# Patient Record
Sex: Female | Born: 1980
Health system: Southern US, Community
[De-identification: ages and names within clinical notes are randomized; demographics above are authoritative.]

## PROBLEM LIST (undated history)

## (undated) DIAGNOSIS — E119 Type 2 diabetes mellitus without complications: Secondary | ICD-10-CM

## (undated) DIAGNOSIS — O24419 Gestational diabetes mellitus in pregnancy, unspecified control: Secondary | ICD-10-CM

## (undated) DIAGNOSIS — I10 Essential (primary) hypertension: Secondary | ICD-10-CM

## (undated) HISTORY — DX: Essential (primary) hypertension: I10

## (undated) HISTORY — DX: Type 2 diabetes mellitus without complications: E11.9

## (undated) HISTORY — DX: Gestational diabetes mellitus in pregnancy, unspecified control: O24.419

---

## 2019-05-17 ENCOUNTER — Ambulatory Visit (INDEPENDENT_AMBULATORY_CARE_PROVIDER_SITE_OTHER): Payer: Medicaid Other | Admitting: Family Medicine

## 2019-05-17 ENCOUNTER — Encounter: Payer: Self-pay | Admitting: Family Medicine

## 2019-05-17 ENCOUNTER — Other Ambulatory Visit (HOSPITAL_COMMUNITY)
Admission: RE | Admit: 2019-05-17 | Discharge: 2019-05-17 | Disposition: A | Discharge status: Home / Self Care | Payer: Medicaid Other | Source: Ambulatory Visit | Attending: Family Medicine | Admitting: Family Medicine

## 2019-05-17 ENCOUNTER — Other Ambulatory Visit: Payer: Self-pay

## 2019-05-17 VITALS — BP 155/81 | HR 122 | Ht 61.0 in | Wt 250.0 lb

## 2019-05-17 DIAGNOSIS — O09299 Supervision of pregnancy with other poor reproductive or obstetric history, unspecified trimester: Secondary | ICD-10-CM | POA: Insufficient documentation

## 2019-05-17 DIAGNOSIS — Z3480 Encounter for supervision of other normal pregnancy, unspecified trimester: Secondary | ICD-10-CM | POA: Insufficient documentation

## 2019-05-17 DIAGNOSIS — Z23 Encounter for immunization: Secondary | ICD-10-CM | POA: Diagnosis not present

## 2019-05-17 DIAGNOSIS — Z8632 Personal history of gestational diabetes: Secondary | ICD-10-CM | POA: Insufficient documentation

## 2019-05-17 DIAGNOSIS — Z3A10 10 weeks gestation of pregnancy: Secondary | ICD-10-CM

## 2019-05-17 DIAGNOSIS — Z98891 History of uterine scar from previous surgery: Secondary | ICD-10-CM | POA: Insufficient documentation

## 2019-05-17 DIAGNOSIS — O24111 Pre-existing diabetes mellitus, type 2, in pregnancy, first trimester: Secondary | ICD-10-CM

## 2019-05-17 DIAGNOSIS — O09291 Supervision of pregnancy with other poor reproductive or obstetric history, first trimester: Secondary | ICD-10-CM

## 2019-05-17 DIAGNOSIS — O24119 Pre-existing diabetes mellitus, type 2, in pregnancy, unspecified trimester: Secondary | ICD-10-CM

## 2019-05-17 LAB — POCT URINALYSIS DIPSTICK OB
Blood, UA: NEGATIVE
Ketones, UA: POSITIVE
Leukocytes, UA: NEGATIVE
Nitrite, UA: NEGATIVE
POC,PROTEIN,UA: NEGATIVE
Spec Grav, UA: 1.02 (ref 1.010–1.025)
pH, UA: 8 (ref 5.0–8.0)

## 2019-05-17 NOTE — Progress Notes (Signed)
Subjective:  Sandra Mason is a G2P1001 102w4d being seen today for her first obstetrical visit.  Her obstetrical history is significant for advanced maternal age and c/s for first delivery due to preeclamsia, GDM on insulin, and breech.. Patient does intend to breast feed. Pregnancy history fully reviewed.  Patient reports no complaints.  BP (!) 155/81   Pulse (!) 122   Ht 5\' 1"  (1.549 m)   Wt 250 lb (113.4 kg)   LMP 03/04/2019 (Exact Date)   BMI 47.24 kg/m   HISTORY: OB History  Gravida Para Term Preterm AB Living  2 1 1     1   SAB TAB Ectopic Multiple Live Births          1    # Outcome Date GA Lbr Len/2nd Weight Sex Delivery Anes PTL Lv  2 Current           1 Term 2016 [redacted]w[redacted]d   F CS-LTranv EPI N LIV    History reviewed. No pertinent past medical history.  Past Surgical History:  Procedure Laterality Date  . CESAREAN SECTION      Family History  Problem Relation Age of Onset  . Diabetes Mother   . Hypertension Mother   . Lung cancer Father      Exam  BP (!) 155/81   Pulse (!) 122   Ht 5\' 1"  (1.549 m)   Wt 250 lb (113.4 kg)   LMP 03/04/2019 (Exact Date)   BMI 47.24 kg/m   CONSTITUTIONAL: Well-developed, well-nourished female in no acute distress.  HENT:  Normocephalic, atraumatic, External right and left ear normal. Oropharynx is clear and moist EYES: Conjunctivae and EOM are normal. Pupils are equal, round, and reactive to light. No scleral icterus.  NECK: Normal range of motion, supple, no masses.  Normal thyroid.  CARDIOVASCULAR: Normal heart rate noted, regular rhythm RESPIRATORY: Clear to auscultation bilaterally. Effort and breath sounds normal, no problems with respiration noted. BREASTS: Symmetric in size. No masses, skin changes, nipple drainage, or lymphadenopathy. ABDOMEN: Soft, normal bowel sounds, no distention noted.  No tenderness, rebound or guarding.  PELVIC: Normal appearing external genitalia; normal appearing vaginal mucosa and cervix.  No abnormal discharge noted. Normal uterine size, no other palpable masses, no uterine or adnexal tenderness. MUSCULOSKELETAL: Normal range of motion. No tenderness.  No cyanosis, clubbing, or edema.  2+ distal pulses. SKIN: Skin is warm and dry. No rash noted. Not diaphoretic. No erythema. No pallor. NEUROLOGIC: Alert and oriented to person, place, and time. Normal reflexes, muscle tone coordination. No cranial nerve deficit noted. PSYCHIATRIC: Normal mood and affect. Normal behavior. Normal judgment and thought content.    Assessment:    Pregnancy: G2P1001 Patient Active Problem List   Diagnosis Date Noted  . Supervision of other normal pregnancy, antepartum 05/17/2019  . Hx of pre-eclampsia in prior pregnancy, currently pregnant, unspecified trimester 05/17/2019  . History of gestational diabetes mellitus (GDM) 05/17/2019  . History of C-section 05/17/2019      Plan:   1. Supervision of other normal pregnancy, antepartum Desires panorama. - HgB A1c - Hemoglobinopathy Evaluation - Obstetric Panel, Including HIV - SMN1 COPY NUMBER ANALYSIS (SMA Carrier Screen) - Cystic fibrosis gene test - Flu Vaccine QUAD 36+ mos IM - Culture, OB Urine - Protein / creatinine ratio, urine - POC Urinalysis Dipstick OB - Cytology - PAP( )  2. Hx of pre-eclampsia in prior pregnancy, currently pregnant, unspecified trimester ASA at 12 weeks CMP, UP:C  - HgB A1c - Hemoglobinopathy Evaluation -  Obstetric Panel, Including HIV - SMN1 COPY NUMBER ANALYSIS (SMA Carrier Screen) - Cystic fibrosis gene test - Flu Vaccine QUAD 36+ mos IM - Culture, OB Urine - Protein / creatinine ratio, urine - POC Urinalysis Dipstick OB  3. History of C-section Desires TOLAC Patient counseled, but paper not signed yet. - HgB A1c - Hemoglobinopathy Evaluation - Obstetric Panel, Including HIV - SMN1 COPY NUMBER ANALYSIS (SMA Carrier Screen) - Cystic fibrosis gene test - Flu Vaccine QUAD 36+ mos  IM - Culture, OB Urine - Protein / creatinine ratio, urine  4. History of gestational diabetes mellitus (GDM) Check HgA1c  5. Obesity     Problem list reviewed and updated. 75% of 30 min visit spent on counseling and coordination of care.     Truett Mainland 05/17/2019

## 2019-05-18 LAB — PROTEIN / CREATININE RATIO, URINE
Creatinine, Urine: 48.3 mg/dL
Protein, Ur: 70.2 mg/dL
Protein/Creat Ratio: 1453 mg/g creat — ABNORMAL HIGH (ref 0–200)

## 2019-05-20 LAB — URINE CULTURE, OB REFLEX

## 2019-05-20 LAB — CULTURE, OB URINE

## 2019-05-25 LAB — HEMOGLOBINOPATHY EVALUATION
Ferritin: 141 ng/mL (ref 15–150)
Hgb A2 Quant: 2.1 % (ref 1.8–3.2)
Hgb A: 97.9 % (ref 96.4–98.8)
Hgb C: 0 %
Hgb F Quant: 0 % (ref 0.0–2.0)
Hgb S: 0 %
Hgb Solubility: NEGATIVE
Hgb Variant: 0 %

## 2019-05-25 LAB — OBSTETRIC PANEL, INCLUDING HIV
Antibody Screen: NEGATIVE
Basophils Absolute: 0.1 10*3/uL (ref 0.0–0.2)
Basos: 1 %
EOS (ABSOLUTE): 0.3 10*3/uL (ref 0.0–0.4)
Eos: 3 %
HIV Screen 4th Generation wRfx: NONREACTIVE
Hematocrit: 43.2 % (ref 34.0–46.6)
Hemoglobin: 14.7 g/dL (ref 11.1–15.9)
Hepatitis B Surface Ag: NEGATIVE
Immature Grans (Abs): 0.2 10*3/uL — ABNORMAL HIGH (ref 0.0–0.1)
Immature Granulocytes: 2 %
Lymphocytes Absolute: 2.1 10*3/uL (ref 0.7–3.1)
Lymphs: 21 %
MCH: 29.6 pg (ref 26.6–33.0)
MCHC: 34 g/dL (ref 31.5–35.7)
MCV: 87 fL (ref 79–97)
Monocytes Absolute: 0.6 10*3/uL (ref 0.1–0.9)
Monocytes: 6 %
Neutrophils Absolute: 7 10*3/uL (ref 1.4–7.0)
Neutrophils: 67 %
Platelets: 306 10*3/uL (ref 150–450)
RBC: 4.97 x10E6/uL (ref 3.77–5.28)
RDW: 13.2 % (ref 11.7–15.4)
RPR Ser Ql: NONREACTIVE
Rh Factor: POSITIVE
Rubella Antibodies, IGG: 17.4 index (ref >0.99)
WBC: 10.3 10*3/uL (ref 3.4–10.8)

## 2019-05-25 LAB — HEMOGLOBIN A1C
Est. average glucose Bld gHb Est-mCnc: 214 mg/dL
Hgb A1c MFr Bld: 9.1 % — ABNORMAL HIGH (ref 4.8–5.6)

## 2019-05-25 LAB — SMN1 COPY NUMBER ANALYSIS (SMA CARRIER SCREENING)

## 2019-05-25 LAB — CYSTIC FIBROSIS GENE TEST

## 2019-05-25 MED ORDER — GLUCOSE BLOOD VI STRP: ORAL_STRIP | 12 refills | Status: DC

## 2019-05-25 MED ORDER — ACCU-CHEK FASTCLIX LANCETS MISC: 1.0000 [IU] | Freq: Four times a day (QID) | 12 refills | Status: AC

## 2019-05-25 MED ORDER — ACCU-CHEK NANO SMARTVIEW W/DEVICE KIT: 1.0000 | PACK | 0 refills | Status: AC

## 2019-05-25 NOTE — Addendum Note (Signed)
Addended by: Truett Mainland on: 05/25/2019 10:42 AM   Modules accepted: Orders

## 2019-05-28 ENCOUNTER — Telehealth: Payer: Self-pay

## 2019-05-28 NOTE — Telephone Encounter (Signed)
Called pt regarding diabetes results. Unable to leave message due to pt's phone not working. Will try to call again and give pt results at [place] on 05/31/19.  chiquita l wilson, CMA

## 2019-05-29 LAB — CYTOLOGY - PAP
Comment: NEGATIVE
Diagnosis: NEGATIVE
Diagnosis: REACTIVE
High risk HPV: NEGATIVE

## 2019-05-31 ENCOUNTER — Ambulatory Visit (HOSPITAL_BASED_OUTPATIENT_CLINIC_OR_DEPARTMENT_OTHER)
Admission: RE | Admit: 2019-05-31 | Discharge: 2019-05-31 | Disposition: A | Discharge status: Home / Self Care | Payer: Medicaid Other | Source: Ambulatory Visit | Attending: Family Medicine | Admitting: Family Medicine

## 2019-05-31 ENCOUNTER — Ambulatory Visit (HOSPITAL_BASED_OUTPATIENT_CLINIC_OR_DEPARTMENT_OTHER): Payer: Medicaid Other

## 2019-05-31 ENCOUNTER — Other Ambulatory Visit: Payer: Self-pay

## 2019-05-31 ENCOUNTER — Ambulatory Visit (INDEPENDENT_AMBULATORY_CARE_PROVIDER_SITE_OTHER): Payer: Medicaid Other | Admitting: Family Medicine

## 2019-05-31 VITALS — BP 142/94 | HR 120 | Wt 249.0 lb

## 2019-05-31 DIAGNOSIS — Z3480 Encounter for supervision of other normal pregnancy, unspecified trimester: Secondary | ICD-10-CM

## 2019-05-31 DIAGNOSIS — O09299 Supervision of pregnancy with other poor reproductive or obstetric history, unspecified trimester: Secondary | ICD-10-CM

## 2019-05-31 DIAGNOSIS — O10919 Unspecified pre-existing hypertension complicating pregnancy, unspecified trimester: Secondary | ICD-10-CM | POA: Insufficient documentation

## 2019-05-31 DIAGNOSIS — Z6841 Body Mass Index (BMI) 40.0 and over, adult: Secondary | ICD-10-CM

## 2019-05-31 DIAGNOSIS — I10 Essential (primary) hypertension: Secondary | ICD-10-CM | POA: Insufficient documentation

## 2019-05-31 DIAGNOSIS — O24119 Pre-existing diabetes mellitus, type 2, in pregnancy, unspecified trimester: Secondary | ICD-10-CM | POA: Insufficient documentation

## 2019-05-31 NOTE — Progress Notes (Signed)
No Gestational sac seen. Will get HCG and formal US.  CMP today

## 2019-05-31 NOTE — Progress Notes (Signed)
Bedside ultrasound perform and and no gestational sac visualized. Patient sent for formal ultrasound with imaging today. Kathrene Alu RN

## 2019-06-01 ENCOUNTER — Telehealth: Payer: Self-pay

## 2019-06-01 ENCOUNTER — Other Ambulatory Visit: Payer: Self-pay

## 2019-06-01 ENCOUNTER — Other Ambulatory Visit (HOSPITAL_BASED_OUTPATIENT_CLINIC_OR_DEPARTMENT_OTHER): Payer: Medicaid Other

## 2019-06-01 DIAGNOSIS — O0990 Supervision of high risk pregnancy, unspecified, unspecified trimester: Secondary | ICD-10-CM

## 2019-06-01 LAB — COMPREHENSIVE METABOLIC PANEL
ALT: 31 IU/L (ref 0–32)
ALT: 28 IU/L (ref 0–32)
AST: 27 IU/L (ref 0–40)
AST: 27 IU/L (ref 0–40)
Albumin/Globulin Ratio: 1.4 (ref 1.2–2.2)
Albumin/Globulin Ratio: 1.3 (ref 1.2–2.2)
Albumin: 3.7 g/dL — ABNORMAL LOW (ref 3.8–4.8)
Albumin: 3.7 g/dL — ABNORMAL LOW (ref 3.8–4.8)
Alkaline Phosphatase: 84 IU/L (ref 39–117)
Alkaline Phosphatase: 74 IU/L (ref 39–117)
BUN/Creatinine Ratio: 20 (ref 9–23)
BUN/Creatinine Ratio: 16 (ref 9–23)
BUN: 12 mg/dL (ref 6–20)
BUN: 10 mg/dL (ref 6–20)
Bilirubin Total: <0.2 mg/dL (ref 0.0–1.2)
Bilirubin Total: 0.2 mg/dL (ref 0.0–1.2)
CO2: 14 mmol/L — ABNORMAL LOW (ref 20–29)
CO2: 19 mmol/L — ABNORMAL LOW (ref 20–29)
Calcium: 9.5 mg/dL (ref 8.7–10.2)
Calcium: 9.7 mg/dL (ref 8.7–10.2)
Chloride: 97 mmol/L (ref 96–106)
Chloride: 99 mmol/L (ref 96–106)
Creatinine, Ser: 0.59 mg/dL (ref 0.57–1.00)
Creatinine, Ser: 0.63 mg/dL (ref 0.57–1.00)
GFR calc Af Amer: 135 mL/min/{1.73_m2} (ref >59)
GFR calc Af Amer: 133 mL/min/{1.73_m2} (ref >59)
GFR calc non Af Amer: 117 mL/min/{1.73_m2} (ref >59)
GFR calc non Af Amer: 115 mL/min/{1.73_m2} (ref >59)
Globulin, Total: 2.7 g/dL (ref 1.5–4.5)
Globulin, Total: 2.8 g/dL (ref 1.5–4.5)
Glucose: 197 mg/dL — ABNORMAL HIGH (ref 65–99)
Glucose: 237 mg/dL — ABNORMAL HIGH (ref 65–99)
Potassium: 3.7 mmol/L (ref 3.5–5.2)
Potassium: 4.2 mmol/L (ref 3.5–5.2)
Sodium: 132 mmol/L — ABNORMAL LOW (ref 134–144)
Sodium: 135 mmol/L (ref 134–144)
Total Protein: 6.4 g/dL (ref 6.0–8.5)
Total Protein: 6.5 g/dL (ref 6.0–8.5)

## 2019-06-01 LAB — SPECIMEN STATUS REPORT

## 2019-06-01 LAB — BETA HCG QUANT (REF LAB): hCG Quant: 38824 m[IU]/mL

## 2019-06-01 LAB — TSH: TSH: 0.956 u[IU]/mL (ref 0.450–4.500)

## 2019-06-01 MED ORDER — PRENATAL VITAMINS 28-0.8 MG PO TABS: 0.8000 mg | ORAL_TABLET | Freq: Every day | ORAL | 11 refills | Status: DC

## 2019-06-01 NOTE — Telephone Encounter (Signed)
Pt called requesting [place] results. Pt made aware that she embryo and cardiac activity was present bu yolk sac was not visualized. Pt made aware that she is 12w and 5d and baby's heart rate was 169. Pt advised to take prenatal vitamins. Understanding was voiced.  chiquita l wilson, CMA

## 2019-06-01 NOTE — Telephone Encounter (Signed)
Patient called and phone rings busy.   Trying to rely to patient that ultrasound looked good and her baby measures to her dates. Kathrene Alu RN

## 2019-06-04 ENCOUNTER — Ambulatory Visit: Payer: Medicaid Other

## 2019-06-05 ENCOUNTER — Other Ambulatory Visit: Payer: Self-pay

## 2019-06-05 ENCOUNTER — Ambulatory Visit: Payer: Medicaid Other | Admitting: *Deleted

## 2019-06-05 ENCOUNTER — Encounter: Payer: Medicaid Other | Attending: Obstetrics & Gynecology | Admitting: *Deleted

## 2019-06-05 DIAGNOSIS — Z3A00 Weeks of gestation of pregnancy not specified: Secondary | ICD-10-CM | POA: Insufficient documentation

## 2019-06-05 DIAGNOSIS — O24419 Gestational diabetes mellitus in pregnancy, unspecified control: Secondary | ICD-10-CM | POA: Insufficient documentation

## 2019-06-05 NOTE — Progress Notes (Signed)
Patient was seen on 06/05/2019 for type 2 Diabetes and pregnancy self-management. EDD 12/09/2019. Patient states history of this diabetes for 3 years since she had GDM with her 37 year old child. She has not had insurance, so no medication or follow up until now.  Diet history obtained. Patient eats good variety of all food groups and beverages include water and occasionally some diluted orange juice.  She states some training on Carbohydrate Counting. She states she is walking with her husband about 30 minutes every day. Patient is currently on no diabetes medications.  The following learning objectives were met by the patient :   States the definition of type 2 Diabetes and pregnancy   States why dietary management is important in controlling blood glucose  Describes the effects of carbohydrates on blood glucose levels  Demonstrates ability to create a balanced meal plan  Demonstrates carbohydrate counting   States when to check blood glucose levels  Demonstrates proper blood glucose monitoring techniques  States the effect of stress and exercise on blood glucose levels  States the importance of limiting caffeine and abstaining from alcohol and smoking  Plan:   Aim for 3 Carb Choices per meal (45 grams) +/- 1 either way   Aim for 1-2 Carbs per snack  Begin reading food labels for Total Carbohydrate of foods  Consider  increasing your activity level by walking or other activity daily as tolerated  Begin checking BG before breakfast and 2 hours after first bite of breakfast, lunch and dinner as directed by MD   Bring Log Book/Sheet and meter to every medical appointment OR use Baby Scripts (see below)  Patient was introduced to [employer], she plans to use as record of BG electronically   Take medication as directed by MD  Patient already has a meter: Accu Chek Guide Me And is testing pre breakfast and 2 hours each meal as directed by MD Review of Log Book shows: MD to follow  now that she is going to use Baby Scripts Patient instructed to test pre breakfast and 2 hours each meal as directed by MD  Patient instructed to monitor glucose levels: FBS: 60 - 95 mg/dl 2 hour: <120 mg/dl  Patient received the following handouts:  Nutrition Diabetes and Pregnancy  Carbohydrate Counting List  Patient will be seen for follow-up in one month to assess her control and provide additional education as needed

## 2019-06-06 NOTE — Progress Notes (Signed)
Received babyscripts notification for elevated BG. Reviewed results with Jeanie Sewer, RD, who saw pt yesterday in our office; recommends pt follow up with [facility], DO at appt on 06/08/19.

## 2019-06-08 ENCOUNTER — Other Ambulatory Visit: Payer: Self-pay

## 2019-06-08 ENCOUNTER — Ambulatory Visit (INDEPENDENT_AMBULATORY_CARE_PROVIDER_SITE_OTHER): Payer: Medicaid Other | Admitting: Family Medicine

## 2019-06-08 ENCOUNTER — Telehealth: Payer: Self-pay

## 2019-06-08 VITALS — BP 182/80 | HR 120 | Wt 251.0 lb

## 2019-06-08 DIAGNOSIS — O09291 Supervision of pregnancy with other poor reproductive or obstetric history, first trimester: Secondary | ICD-10-CM

## 2019-06-08 DIAGNOSIS — O10911 Unspecified pre-existing hypertension complicating pregnancy, first trimester: Secondary | ICD-10-CM

## 2019-06-08 DIAGNOSIS — O24111 Pre-existing diabetes mellitus, type 2, in pregnancy, first trimester: Secondary | ICD-10-CM

## 2019-06-08 DIAGNOSIS — O09299 Supervision of pregnancy with other poor reproductive or obstetric history, unspecified trimester: Secondary | ICD-10-CM

## 2019-06-08 DIAGNOSIS — O99211 Obesity complicating pregnancy, first trimester: Secondary | ICD-10-CM

## 2019-06-08 DIAGNOSIS — O0991 Supervision of high risk pregnancy, unspecified, first trimester: Secondary | ICD-10-CM

## 2019-06-08 DIAGNOSIS — O24119 Pre-existing diabetes mellitus, type 2, in pregnancy, unspecified trimester: Secondary | ICD-10-CM

## 2019-06-08 DIAGNOSIS — Z3A13 13 weeks gestation of pregnancy: Secondary | ICD-10-CM

## 2019-06-08 DIAGNOSIS — O0990 Supervision of high risk pregnancy, unspecified, unspecified trimester: Secondary | ICD-10-CM

## 2019-06-08 DIAGNOSIS — Z6841 Body Mass Index (BMI) 40.0 and over, adult: Secondary | ICD-10-CM

## 2019-06-08 DIAGNOSIS — Z98891 History of uterine scar from previous surgery: Secondary | ICD-10-CM

## 2019-06-08 DIAGNOSIS — O10919 Unspecified pre-existing hypertension complicating pregnancy, unspecified trimester: Secondary | ICD-10-CM

## 2019-06-08 MED ORDER — LABETALOL HCL 200 MG PO TABS: 400.0000 mg | ORAL_TABLET | Freq: Two times a day (BID) | ORAL | 3 refills | Status: DC

## 2019-06-08 MED ORDER — INSULIN DETEMIR 100 UNIT/ML ~~LOC~~ SOLN: 20.0000 [IU] | Freq: Two times a day (BID) | SUBCUTANEOUS | 11 refills | Status: DC

## 2019-06-08 MED ORDER — "INSULIN SYRINGE 31G X 5/16"" 0.5 ML MISC": 1.0000 [IU] | Freq: Every day | 11 refills | Status: AC

## 2019-06-08 MED ORDER — INSULIN LISPRO 100 UNIT/ML ~~LOC~~ SOLN: 15.0000 [IU] | Freq: Three times a day (TID) | SUBCUTANEOUS | 11 refills | Status: DC

## 2019-06-08 NOTE — Progress Notes (Addendum)
PRENATAL VISIT NOTE  Subjective:  Sandra Mason is a 38 y.o. G2P1001 at [redacted]w[redacted]d being seen today for ongoing prenatal care.  She is currently monitored for the following issues for this high-risk pregnancy and has Supervision of other normal pregnancy, antepartum; Hx of pre-eclampsia in prior pregnancy, currently pregnant, unspecified trimester; History of gestational diabetes mellitus (GDM); History of C-section; Class 3 severe obesity due to excess calories without serious comorbidity with body mass index (BMI) of 45.0 to 49.9 in adult [facility]); and Chronic hypertension affecting pregnancy on their problem list.  Patient reports no complaints.  Contractions: Not present. Vag. Bleeding: None.  Movement: Absent. Denies leaking of fluid.   The following portions of the patient's history were reviewed and updated as appropriate: allergies, current medications, past family history, past medical history, past social history, past surgical history and problem list.   Objective:   Vitals:   06/08/19 1112 06/08/19 1116  BP: (!) 192/89 (!) 182/80  Pulse: (!) 120 (!) 120  Weight: 251 lb (113.9 kg)     Fetal Status: Fetal Heart Rate (bpm): 172   Movement: Absent     General:  Alert, oriented and cooperative. Patient is in no acute distress.  Skin: Skin is warm and dry. No rash noted.   Cardiovascular: Normal heart rate noted  Respiratory: Normal respiratory effort, no problems with respiration noted  Abdomen: Soft, gravid, appropriate for gestational age.  Pain/Pressure: Absent     Pelvic: Cervical exam deferred        Extremities: Normal range of motion.  Edema: None  Mental Status: Normal mood and affect. Normal behavior. Normal judgment and thought content.   Assessment and Plan:  Pregnancy: G2P1001 at [redacted]w[redacted]d 1. Supervision of high risk pregnancy, antepartum FHT normal - insulin detemir (LEVEMIR) 100 UNIT/ML injection; Inject 0.2 mLs (20 Units total) into the skin 2 (two) times daily.   Dispense: 10 mL; Refill: 11 - insulin lispro (HUMALOG) 100 UNIT/ML injection; Inject 0.15 mLs (15 Units total) into the skin 3 (three) times daily before meals.  Dispense: 10 mL; Refill: 11 - Insulin Syringe-Needle U-100 (INSULIN SYRINGE .5CC/31GX5/16") 31G X 5/16" 0.5 ML MISC; 1 Units by Does not apply route 5 (five) times daily.  Dispense: 100 each; Refill: 11 - Ambulatory referral to Ophthalmology - US Fetal Echocardiography; Future  2. Chronic hypertension affecting pregnancy Start labetalol 400mg  BID ASA 81mg   3. Class 3 severe obesity due to excess calories without serious comorbidity with body mass index (BMI) of 45.0 to 49.9 in adult ([place])  4. Type 2 diabetes mellitus affecting pregnancy, antepartum Start insulin, weight based Levemir 20 units BID Lispro 15 units premeal Needs fetal echo Ophthal referral done.  - insulin detemir (LEVEMIR) 100 UNIT/ML injection; Inject 0.2 mLs (20 Units total) into the skin 2 (two) times daily.  Dispense: 10 mL; Refill: 11 - insulin lispro (HUMALOG) 100 UNIT/ML injection; Inject 0.15 mLs (15 Units total) into the skin 3 (three) times daily before meals.  Dispense: 10 mL; Refill: 11 - Insulin Syringe-Needle U-100 (INSULIN SYRINGE .5CC/31GX5/16") 31G X 5/16" 0.5 ML MISC; 1 Units by Does not apply route 5 (five) times daily.  Dispense: 100 each; Refill: 11 - Ambulatory referral to Ophthalmology - US Fetal Echocardiography; Future  5. Hx of pre-eclampsia in prior pregnancy, currently pregnant, unspecified trimester  6. History of C-section   Preterm labor symptoms and general obstetric precautions including but not limited to vaginal bleeding, contractions, leaking of fluid and fetal movement were reviewed in detail  with the patient. Please refer to After Visit Summary for other counseling recommendations.   No follow-ups on file.  Future Appointments  Date Time Provider [place]  06/15/2019 11:15 AM Truett Mainland, DO [facility] None   06/20/2019  3:00 PM Lavonia Drafts, MD [facility] None    Truett Mainland, DO

## 2019-06-08 NOTE — Telephone Encounter (Signed)
Baby scripts called patient had high glucose reading.   Attempted to reach patient by phone but unable to. Patient is coming for appointment this morning with provider and we will review blood sugars with patient. Kathrene Alu RN

## 2019-06-10 ENCOUNTER — Encounter: Payer: Self-pay | Admitting: Obstetrics & Gynecology

## 2019-06-10 DIAGNOSIS — O09529 Supervision of elderly multigravida, unspecified trimester: Secondary | ICD-10-CM | POA: Insufficient documentation

## 2019-06-12 ENCOUNTER — Other Ambulatory Visit: Payer: Self-pay

## 2019-06-12 DIAGNOSIS — O0990 Supervision of high risk pregnancy, unspecified, unspecified trimester: Secondary | ICD-10-CM

## 2019-06-12 DIAGNOSIS — O24119 Pre-existing diabetes mellitus, type 2, in pregnancy, unspecified trimester: Secondary | ICD-10-CM

## 2019-06-15 ENCOUNTER — Other Ambulatory Visit: Payer: Self-pay

## 2019-06-15 ENCOUNTER — Ambulatory Visit (INDEPENDENT_AMBULATORY_CARE_PROVIDER_SITE_OTHER): Payer: Medicaid Other | Admitting: Family Medicine

## 2019-06-15 VITALS — BP 146/76 | HR 101 | Wt 254.0 lb

## 2019-06-15 DIAGNOSIS — O10919 Unspecified pre-existing hypertension complicating pregnancy, unspecified trimester: Secondary | ICD-10-CM

## 2019-06-15 DIAGNOSIS — O09299 Supervision of pregnancy with other poor reproductive or obstetric history, unspecified trimester: Secondary | ICD-10-CM

## 2019-06-15 DIAGNOSIS — E66813 Obesity, class 3: Secondary | ICD-10-CM

## 2019-06-15 DIAGNOSIS — O24119 Pre-existing diabetes mellitus, type 2, in pregnancy, unspecified trimester: Secondary | ICD-10-CM | POA: Insufficient documentation

## 2019-06-15 DIAGNOSIS — O10912 Unspecified pre-existing hypertension complicating pregnancy, second trimester: Secondary | ICD-10-CM

## 2019-06-15 DIAGNOSIS — O0992 Supervision of high risk pregnancy, unspecified, second trimester: Secondary | ICD-10-CM

## 2019-06-15 DIAGNOSIS — E119 Type 2 diabetes mellitus without complications: Secondary | ICD-10-CM | POA: Insufficient documentation

## 2019-06-15 DIAGNOSIS — O0990 Supervision of high risk pregnancy, unspecified, unspecified trimester: Secondary | ICD-10-CM

## 2019-06-15 DIAGNOSIS — Z3A14 14 weeks gestation of pregnancy: Secondary | ICD-10-CM

## 2019-06-15 DIAGNOSIS — O09292 Supervision of pregnancy with other poor reproductive or obstetric history, second trimester: Secondary | ICD-10-CM

## 2019-06-15 DIAGNOSIS — O24112 Pre-existing diabetes mellitus, type 2, in pregnancy, second trimester: Secondary | ICD-10-CM

## 2019-06-15 DIAGNOSIS — Z6841 Body Mass Index (BMI) 40.0 and over, adult: Secondary | ICD-10-CM

## 2019-06-15 DIAGNOSIS — Z98891 History of uterine scar from previous surgery: Secondary | ICD-10-CM

## 2019-06-15 MED ORDER — PRENATAL VITAMINS 28-0.8 MG PO TABS: 0.8000 mg | ORAL_TABLET | Freq: Every day | ORAL | 11 refills | Status: AC

## 2019-06-15 MED ORDER — PRENATAL VITAMINS 28-0.8 MG PO TABS: 0.8000 mg | ORAL_TABLET | Freq: Every day | ORAL | 11 refills | Status: DC

## 2019-06-15 NOTE — Progress Notes (Signed)
Subjective:  Sandra Mason is a 38 y.o. G2P1001 at [redacted]w[redacted]d being seen today for ongoing prenatal care.  She is currently monitored for the following issues for this high-risk pregnancy and has Supervision of other normal pregnancy, antepartum; Hx of pre-eclampsia in prior pregnancy, currently pregnant, unspecified trimester; History of gestational diabetes mellitus (GDM); History of C-section; Class 3 severe obesity due to excess calories without serious comorbidity with body mass index (BMI) of 45.0 to 49.9 in adult [facility]); Chronic hypertension affecting pregnancy; and AMA (advanced maternal age) multigravida 35+ on their problem list.  GDM: Patient taking Levemir 20units BID, Lispro 15 units TID.  Reports no hypoglycemic episodes.  Tolerating medication well Fasting: 100-110 2hr PP: 140s after breakfast, controlled after lunch and dinner  Patient reports no complaints.   .  .   . Denies leaking of fluid.   The following portions of the patient's history were reviewed and updated as appropriate: allergies, current medications, past family history, past medical history, past social history, past surgical history and problem list. Problem list updated.  Objective:   Vitals:   06/15/19 1116  BP: (!) 146/76  Pulse: (!) 101  Weight: 254 lb (115.2 kg)    Fetal Status: Fetal Heart Rate (bpm): 160         General:  Alert, oriented and cooperative. Patient is in no acute distress.  Skin: Skin is warm and dry. No rash noted.   Cardiovascular: Normal heart rate noted  Respiratory: Normal respiratory effort, no problems with respiration noted  Abdomen: Soft, gravid, appropriate for gestational age.       Pelvic:       Cervical exam deferred        Extremities: Normal range of motion.     Mental Status: Normal mood and affect. Normal behavior. Normal judgment and thought content.   Urinalysis:      Assessment and Plan:  Pregnancy: G2P1001 at [redacted]w[redacted]d  1. Supervision of high risk pregnancy,  antepartum FHT normal  2. Type 2 diabetes mellitus affecting pregnancy, antepartum Overall, dramatic improvement of glycemic control! Increase evening levemir to 25units. After breakfast elevation likely due to elevated fasting. Recheck in 1 week  3. Chronic hypertension affecting pregnancy Improved on labetalol. Continue ASA 81mg   4. Class 3 severe obesity due to excess calories without serious comorbidity with body mass index (BMI) of 45.0 to 49.9 in adult ([place])  5. Hx of pre-eclampsia in prior pregnancy, currently pregnant, unspecified trimester  6. History of C-section  Preterm labor symptoms and general obstetric precautions including but not limited to vaginal bleeding, contractions, leaking of fluid and fetal movement were reviewed in detail with the patient. Please refer to After Visit Summary for other counseling recommendations.  No follow-ups on file.   Truett Mainland, DO

## 2019-06-20 ENCOUNTER — Ambulatory Visit (INDEPENDENT_AMBULATORY_CARE_PROVIDER_SITE_OTHER): Payer: Medicaid Other | Admitting: Obstetrics & Gynecology

## 2019-06-20 ENCOUNTER — Other Ambulatory Visit: Payer: Self-pay

## 2019-06-20 VITALS — BP 144/81 | HR 97 | Wt 254.0 lb

## 2019-06-20 DIAGNOSIS — Z98891 History of uterine scar from previous surgery: Secondary | ICD-10-CM

## 2019-06-20 DIAGNOSIS — O0990 Supervision of high risk pregnancy, unspecified, unspecified trimester: Secondary | ICD-10-CM

## 2019-06-20 DIAGNOSIS — Z3480 Encounter for supervision of other normal pregnancy, unspecified trimester: Secondary | ICD-10-CM

## 2019-06-20 DIAGNOSIS — O24119 Pre-existing diabetes mellitus, type 2, in pregnancy, unspecified trimester: Secondary | ICD-10-CM

## 2019-06-20 DIAGNOSIS — O10912 Unspecified pre-existing hypertension complicating pregnancy, second trimester: Secondary | ICD-10-CM

## 2019-06-20 DIAGNOSIS — O34219 Maternal care for unspecified type scar from previous cesarean delivery: Secondary | ICD-10-CM

## 2019-06-20 DIAGNOSIS — O09529 Supervision of elderly multigravida, unspecified trimester: Secondary | ICD-10-CM

## 2019-06-20 DIAGNOSIS — O09522 Supervision of elderly multigravida, second trimester: Secondary | ICD-10-CM

## 2019-06-20 DIAGNOSIS — O09292 Supervision of pregnancy with other poor reproductive or obstetric history, second trimester: Secondary | ICD-10-CM

## 2019-06-20 DIAGNOSIS — Z3A15 15 weeks gestation of pregnancy: Secondary | ICD-10-CM

## 2019-06-20 DIAGNOSIS — O10919 Unspecified pre-existing hypertension complicating pregnancy, unspecified trimester: Secondary | ICD-10-CM

## 2019-06-20 DIAGNOSIS — O09299 Supervision of pregnancy with other poor reproductive or obstetric history, unspecified trimester: Secondary | ICD-10-CM

## 2019-06-20 DIAGNOSIS — O0992 Supervision of high risk pregnancy, unspecified, second trimester: Secondary | ICD-10-CM

## 2019-06-20 DIAGNOSIS — O24112 Pre-existing diabetes mellitus, type 2, in pregnancy, second trimester: Secondary | ICD-10-CM

## 2019-06-20 DIAGNOSIS — O99212 Obesity complicating pregnancy, second trimester: Secondary | ICD-10-CM

## 2019-06-20 NOTE — Patient Instructions (Signed)
Type 1 or Type 2 Diabetes Mellitus During Pregnancy, Self Care Caring for yourself during your pregnancy when you have type 1 diabetes (type 1 diabetes mellitus) or type 2 diabetes (type 2 diabetes mellitus) means keeping your blood sugar (glucose) under control with a balance of:  Nutrition.  Exercise.  Lifestyle changes.  Insulin or medicines, if necessary.  Support from your team of health care providers and others. The following information explains what you need to know to manage your diabetes at home during your pregnancy. What are the risks? If diabetes is treated, it is unlikely to cause problems. If it is not controlled with treatment, it may cause problems during labor and delivery, and some of those problems can be harmful to the unborn baby (fetus) and the mother. Uncontrolled diabetes may also cause the newborn baby to have breathing problems and low blood glucose. Having diabetes can put you at risk for other long-term (chronic) conditions, such as heart disease and kidney disease. Your health care provider may prescribe medicines to help prevent complications from diabetes. These medicines may include:  Aspirin.  Medicine to lower cholesterol.  Medicine to control blood pressure. How to monitor blood glucose   Check your blood glucose every day, as often as told by your health care provider.  Contact your health care provider if your blood glucose is above your target for 2 tests in a row.  Have your A1c (hemoglobin A1c) level checked at least two times a year, or as often as told by your health care provider. Your health care provider will set personal treatment goals for you. Generally, the goal of treatment is to maintain the following blood glucose levels during pregnancy:  After not eating (after fasting) for 8 hours: at or below 95 mg/dL (5.3 mmol/L).  After meals (postprandial): ? One hour after a meal: at or below 140 mg/dL (7.8 mmol/L). ? Two hours after a  meal: at or below 120 mg/dL (6.7 mmol/L).  A1c level: 6-6.5% How to manage hyperglycemia and hypoglycemia Hyperglycemia symptoms Hyperglycemia, also called high blood glucose, occurs when blood glucose is too high. Make sure you know the early signs of hyperglycemia, such as:  Increased thirst.  Hunger.  Feeling very tired.  Needing to urinate more often than usual.  Blurry vision. Hypoglycemia symptoms Hypoglycemia, also called low blood glucose, occurs with a blood glucose level at or below 70 mg/dL (3.9 mmol/L). The risk for hypoglycemia increases during or after exercise, during sleep, during illness, and when skipping meals or fasting. It is important to know the symptoms of hypoglycemia and treat it right away. Always have a 15-gram rapid-acting carbohydrate snack with you to treat low blood glucose. Family members and close friends should also know the symptoms and should understand how to treat hypoglycemia, in case you are not able to treat yourself. Symptoms may include:  Hunger.  Anxiety.  Sweating and feeling clammy.  Confusion.  Dizziness or feeling light-headed.  Sleepiness.  Nausea.  Increased heart rate.  Headache.  Blurry vision.  Irritability.  Tingling or numbness around the mouth, lips, or tongue.  A change in coordination.  Restless sleep.  Fainting.  Seizure. Treating hypoglycemia If you are alert and able to swallow safely, follow the 15:15 rule:  Take 15 grams of a rapid-acting carbohydrate. Rapid-acting options include: ? 1 tube of glucose gel. ? 3 glucose pills. ? 6-8 pieces of hard candy. ? 4 oz (120 mL) of fruit juice . ? 4 oz (120 mL)  of regular (not diet) soda.  Check your blood glucose 15 minutes after you take the carbohydrate.  If the repeat blood glucose level is still at or below 70 mg/dL (3.9 mmol/L), take 15 grams of a carbohydrate again.  If your blood glucose level does not increase above 70 mg/dL (3.9 mmol/L)  after 3 tries, seek emergency medical care.  After your blood glucose level returns to normal, eat a meal or a snack within 1 hour. Treating severe hypoglycemia Severe hypoglycemia is when your blood glucose level is at or below 54 mg/dL (3 mmol/L). Severe hypoglycemia is an emergency. Do not wait to see if the symptoms will go away. Get medical help right away. Call your local emergency services (911 in the [place] If you have severe hypoglycemia and you cannot eat or drink, you may need an injection of glucagon. A family member or close friend should learn how to check your blood glucose and how to give you a glucagon injection. Ask your health care provider if you need to have an emergency glucagon injection kit available. Severe hypoglycemia may need to be treated in a hospital. The treatment may include getting glucose through an IV. You may also need treatment for the cause of your hypoglycemia. Follow these instructions at home: Take diabetes medicines as told  If your health care provider prescribed insulin or diabetes medicines, take them every day.  Do not run out of insulin or other diabetes medicines that you take. Plan ahead so you always have these available.  If you use insulin, adjust your dosage based on how physically active you are and what foods you eat. Your health care provider will tell you how to adjust your dosage.  Your health care provider may recommend that you take one low-dose aspirin (81 mg) each day to help prevent high blood pressure during pregnancy (preeclampsia or eclampsia). You may be at risk for preeclampsia or eclampsia if: ? You had any of the following during a previous pregnancy:  Preeclampsia or eclampsia.  A fetal growth rate that was slower than normal.  An early (preterm) birth.  Separation of the placenta from the uterus (placental abruption).  Fetal loss. ? You are pregnant with more than one baby. ? You have other medical conditions, such  as high blood pressure or an autoimmune disease. Make healthy food choices  The things that you eat and drink affect your blood glucose and your insulin dosage. Making good choices helps to control your diabetes and prevent other health problems. A healthy meal plan includes eating lean proteins, complex carbohydrates, fresh fruits and vegetables, low-fat dairy products, and healthy fats. Make an appointment to see a diet and nutrition specialist (registered dietitian) to help you create an eating plan that is right for you. Make sure that you:  Follow instructions from your health care provider about eating or drinking restrictions.  Drink enough fluid to keep your urine pale yellow.  Eat healthy snacks between nutritious meals.  Keep a record of the carbohydrates that you eat. Do this by reading food labels and learning the standard serving sizes of foods.  Follow your sick day plan whenever you cannot eat or drink as usual. Make this plan in advance with your health care provider.  Stay active  Do at least 30 minutes of physical activity a day, or as much physical activity as your health care provider recommends during your pregnancy.  If you start a new exercise or activity, work with your health  care provider to adjust your insulin, medicines, or food intake as needed. Make healthy lifestyle choices  Do not drink alcohol.  Do not use any tobacco products, such as cigarettes, chewing tobacco, and e-cigarettes. If you need help quitting, ask your health care provider.  Learn to manage stress. If you need help with this, ask your health care provider. Care for your body  Keep your immunizations up to date.  Schedule an eye exam during your first trimester of your pregnancy, or as told by your health care provider.  Check your skin and feet every day for cuts, bruises, redness, blisters, or sores. Schedule a foot exam with your health care provider once every year.  Brush your  teeth and gums two times a day, and floss one or more times a day. Visit your dentist one or more times every 6 months.  Maintain a healthy weight during your pregnancy. General instructions  Take over-the-counter and prescription medicines only as told by your health care provider.  Talk with your health care provider about your risk for high blood pressure during pregnancy (preeclampsia or eclampsia).  Share your diabetes management plan with people in your workplace, school, and household.  Check your urine ketones when you are ill and as told by your health care provider.  Carry a medical alert card or wear medical alert jewelry.  Keep all follow-up visits during your pregnancy (prenatal) and after delivery (postnatal) as told by your health care provider. This is important. Questions to ask your health care provider  Do I need to meet with a diabetes educator?  Where can I find a support group for people with diabetes? Where to find more information For more information about diabetes, visit:  American Diabetes Association (ADA): www.diabetes.org  American Association of Diabetes Educators (AADE): www.diabeteseducator.org Summary  Caring for yourself when you have type 1 or type 2 diabetes means keeping your blood sugar (glucose) under control. You can do that with a balance of insulin and other medicines, nutrition, exercise, and lifestyle changes.  Check your blood glucose every day, as often as told by your health care provider.  If your health care provider prescribed insulin or diabetes medicines, take them every day.  Keep all follow-up visits during your pregnancy (prenatal) and after delivery (postnatal) as told by your health care provider. This is important. This information is not intended to replace advice given to you by your health care provider. Make sure you discuss any questions you have with your health care provider. Document Released: 11/17/2015 Document  Revised: 09/23/2017 Document Reviewed: 08/29/2015 Elsevier Patient Education  2020 [employer].

## 2019-06-20 NOTE — Progress Notes (Addendum)
   PRENATAL VISIT NOTE  Subjective:  Sandra Mason is a 38 y.o. G2P1001 at [redacted]w[redacted]d being seen today for ongoing prenatal care.  She is currently monitored for the following issues for this high-risk pregnancy and has Supervision of other normal pregnancy, antepartum; Hx of pre-eclampsia in prior pregnancy, currently pregnant, unspecified trimester; History of gestational diabetes mellitus (GDM); History of C-section; Class 3 severe obesity due to excess calories without serious comorbidity with body mass index (BMI) of 45.0 to 49.9 in adult [facility]); Chronic hypertension affecting pregnancy; AMA (advanced maternal age) multigravida 15+; and Type 2 diabetes mellitus affecting pregnancy, antepartum on their problem list.  Patient reports no complaints.  Contractions: Not present. Vag. Bleeding: None.  Movement: Absent. Denies leaking of fluid.   The following portions of the patient's history were reviewed and updated as appropriate: allergies, current medications, past family history, past medical history, past social history, past surgical history and problem list.   Objective:   Vitals:   06/20/19 1505  BP: (!) 144/81  Pulse: 97  Weight: 254 lb (115.2 kg)    Fetal Status:     Movement: Absent     General:  Alert, oriented and cooperative. Patient is in no acute distress.  Skin: Skin is warm and dry. No rash noted.   Cardiovascular: Normal heart rate noted  Respiratory: Normal respiratory effort, no problems with respiration noted  Abdomen: Soft, gravid, appropriate for gestational age.  Pain/Pressure: Absent     Pelvic: Cervical exam deferred        Extremities: Normal range of motion.  Edema: None  Mental Status: Normal mood and affect. Normal behavior. Normal judgment and thought content.   Assessment and Plan:  Pregnancy: G2P1001 at [redacted]w[redacted]d 1. Supervision of high risk pregnancy, antepartum  - AFP, Quad Screen (15.0-22.6)*LC - [facility] or [facility] - [place] MFM OB DETAIL +14 WK; Future   2. Type 2 diabetes mellitus affecting pregnancy, antepartum  - AFP, Quad Screen (15.0-22.6)*LC - [facility] or [facility] - [place] MFM OB DETAIL +14 WK; Future  Increase pm Levemir from 20 to 25 units.(pt was initially incorrectly told to increase am dose!)    3. Chronic hypertension affecting pregnancy  - AFP, Quad Screen (15.0-22.6)*LC - [facility] or [facility] - [place] MFM OB DETAIL +14 WK; Future Baby ASA  4. Hx of pre-eclampsia in prior pregnancy, currently pregnant, unspecified trimester See above  5. Supervision of other normal pregnancy, antepartum  6. History of C-section Desires VBAC  7. Class 3 severe obesity due to excess calories without serious comorbidity with body mass index (BMI) of 45.0 to 49.9 in adult ([place])  8. Antepartum multigravida of advanced maternal age  Preterm labor symptoms and general obstetric precautions including but not limited to vaginal bleeding, contractions, leaking of fluid and fetal movement were reviewed in detail with the patient. Please refer to After Visit Summary for other counseling recommendations.   No follow-ups on file.  Future Appointments  Date Time Provider [place]  07/13/2019  9:30 AM WH-MFC [place] 5 WH-MFCUS MFC-US  07/13/2019  9:40 AM WH-MFC NURSE WH-MFC MFC-US    Lavonia Drafts, MD

## 2019-06-23 LAB — AFP TETRA
DIA Mom Value: 0.76
DIA Value (EIA): 98.77 pg/mL
DSR (By Age)    1 IN: 159
DSR (Second Trimester) 1 IN: 443
Gestational Age: 15.3 WEEKS
MSAFP Mom: 0.57
MSAFP: 12.7 ng/mL
MSHCG Mom: 1
MSHCG: 36731 m[IU]/mL
Maternal Age At EDD: 37.7 yr
Osb Risk: 10000
T18 (By Age): 1:618 {titer}
Test Results:: NEGATIVE
Weight: 254 [lb_av]
uE3 Mom: 0.73
uE3 Value: 0.52 ng/mL

## 2019-06-25 ENCOUNTER — Other Ambulatory Visit: Payer: Self-pay

## 2019-06-25 ENCOUNTER — Telehealth: Payer: Self-pay

## 2019-06-25 DIAGNOSIS — O24119 Pre-existing diabetes mellitus, type 2, in pregnancy, unspecified trimester: Secondary | ICD-10-CM

## 2019-06-25 MED ORDER — GLUCOSE BLOOD VI STRP: ORAL_STRIP | 11 refills | Status: AC

## 2019-06-25 NOTE — Telephone Encounter (Signed)
Pt called the office requesting a refill of Levemir. Pt states that her dosage was increased from 20 to 25 units. Pt made aware that message will be sent to provider. Understanding was voiced.  Tennie Grussing l Laquasha Groome, CMA

## 2019-06-25 NOTE — Progress Notes (Signed)
New Rx for glucose test strips was sent to the pharmacy.  Alison Kubicki l Zoya Sprecher, CMA

## 2019-06-26 ENCOUNTER — Other Ambulatory Visit: Payer: Self-pay

## 2019-06-26 DIAGNOSIS — O0990 Supervision of high risk pregnancy, unspecified, unspecified trimester: Secondary | ICD-10-CM

## 2019-06-26 DIAGNOSIS — O24119 Pre-existing diabetes mellitus, type 2, in pregnancy, unspecified trimester: Secondary | ICD-10-CM

## 2019-06-26 MED ORDER — INSULIN DETEMIR 100 UNIT/ML ~~LOC~~ SOLN: 25.0000 [IU] | Freq: Two times a day (BID) | SUBCUTANEOUS | 11 refills | Status: DC

## 2019-06-27 ENCOUNTER — Other Ambulatory Visit: Payer: Self-pay | Admitting: *Deleted

## 2019-06-27 DIAGNOSIS — O24419 Gestational diabetes mellitus in pregnancy, unspecified control: Secondary | ICD-10-CM

## 2019-07-02 ENCOUNTER — Telehealth: Payer: Self-pay

## 2019-07-02 ENCOUNTER — Other Ambulatory Visit (HOSPITAL_COMMUNITY): Payer: Self-pay | Admitting: Obstetrics & Gynecology

## 2019-07-02 NOTE — Telephone Encounter (Signed)
Called pt to discuss insulin dosages. Pt states she was taking 25 units of Levemir in the morning and 25 units at night. Pt made aware that starting today, her dosage of Levemir has changed to 25 units every morning and 30 units every night. Humalog has changed to 20 units in the morning and 15 units every after noon and evening. Understanding was voiced.  Sandra Mason, CMA

## 2019-07-02 NOTE — Telephone Encounter (Signed)
-----   Message from Doran Heater, RN sent at 07/02/2019  2:43 PM EST -----  ----- Message ----- From: Lavonia Drafts, MD Sent: 07/02/2019  11:20 AM EST To: Doran Heater, RN  Please call pt. Pleas confirm her current meds.   Levemir 25 unit q am and 20 units q pm??? Please let  me know if her dosage is different.    She should be on Levemir 25 units q am and 30units q pm.   Reg 20 units in the am and 15 units q afternoon and evening.   Thx,  Clh-S

## 2019-07-09 ENCOUNTER — Other Ambulatory Visit: Payer: Self-pay

## 2019-07-09 DIAGNOSIS — O24119 Pre-existing diabetes mellitus, type 2, in pregnancy, unspecified trimester: Secondary | ICD-10-CM

## 2019-07-09 DIAGNOSIS — O0990 Supervision of high risk pregnancy, unspecified, unspecified trimester: Secondary | ICD-10-CM

## 2019-07-09 MED ORDER — INSULIN DETEMIR 100 UNIT/ML ~~LOC~~ SOLN: SUBCUTANEOUS | 11 refills | Status: DC

## 2019-07-09 NOTE — Telephone Encounter (Signed)
Patient consulted with her blood sugars (pulled from babyscripts). Per Dr. Ihor Dow patient needs to change Levemir to 34 units at night. New script sent.

## 2019-07-13 ENCOUNTER — Telehealth: Payer: Self-pay

## 2019-07-13 ENCOUNTER — Other Ambulatory Visit (HOSPITAL_COMMUNITY): Payer: Self-pay | Admitting: *Deleted

## 2019-07-13 ENCOUNTER — Ambulatory Visit (HOSPITAL_COMMUNITY)
Admission: RE | Admit: 2019-07-13 | Discharge: 2019-07-13 | Disposition: A | Discharge status: Home / Self Care | Payer: Medicaid Other | Source: Ambulatory Visit | Attending: Obstetrics & Gynecology | Admitting: Obstetrics & Gynecology

## 2019-07-13 ENCOUNTER — Other Ambulatory Visit: Payer: Self-pay

## 2019-07-13 ENCOUNTER — Ambulatory Visit (HOSPITAL_BASED_OUTPATIENT_CLINIC_OR_DEPARTMENT_OTHER): Payer: Medicaid Other | Admitting: *Deleted

## 2019-07-13 ENCOUNTER — Encounter (HOSPITAL_COMMUNITY): Payer: Self-pay

## 2019-07-13 DIAGNOSIS — O10919 Unspecified pre-existing hypertension complicating pregnancy, unspecified trimester: Secondary | ICD-10-CM | POA: Diagnosis present

## 2019-07-13 DIAGNOSIS — O09299 Supervision of pregnancy with other poor reproductive or obstetric history, unspecified trimester: Secondary | ICD-10-CM | POA: Diagnosis present

## 2019-07-13 DIAGNOSIS — O24112 Pre-existing diabetes mellitus, type 2, in pregnancy, second trimester: Secondary | ICD-10-CM

## 2019-07-13 DIAGNOSIS — O09522 Supervision of elderly multigravida, second trimester: Secondary | ICD-10-CM

## 2019-07-13 DIAGNOSIS — O24119 Pre-existing diabetes mellitus, type 2, in pregnancy, unspecified trimester: Secondary | ICD-10-CM | POA: Diagnosis present

## 2019-07-13 DIAGNOSIS — Z3A18 18 weeks gestation of pregnancy: Secondary | ICD-10-CM | POA: Diagnosis present

## 2019-07-13 DIAGNOSIS — O10012 Pre-existing essential hypertension complicating pregnancy, second trimester: Secondary | ICD-10-CM | POA: Diagnosis not present

## 2019-07-13 DIAGNOSIS — Z98891 History of uterine scar from previous surgery: Secondary | ICD-10-CM | POA: Diagnosis present

## 2019-07-13 DIAGNOSIS — O0990 Supervision of high risk pregnancy, unspecified, unspecified trimester: Secondary | ICD-10-CM | POA: Insufficient documentation

## 2019-07-13 DIAGNOSIS — O99212 Obesity complicating pregnancy, second trimester: Secondary | ICD-10-CM | POA: Diagnosis not present

## 2019-07-13 DIAGNOSIS — O10912 Unspecified pre-existing hypertension complicating pregnancy, second trimester: Secondary | ICD-10-CM

## 2019-07-13 DIAGNOSIS — Z3480 Encounter for supervision of other normal pregnancy, unspecified trimester: Secondary | ICD-10-CM

## 2019-07-13 DIAGNOSIS — O09292 Supervision of pregnancy with other poor reproductive or obstetric history, second trimester: Secondary | ICD-10-CM

## 2019-07-13 DIAGNOSIS — O24414 Gestational diabetes mellitus in pregnancy, insulin controlled: Secondary | ICD-10-CM

## 2019-07-13 DIAGNOSIS — Z6841 Body Mass Index (BMI) 40.0 and over, adult: Secondary | ICD-10-CM | POA: Insufficient documentation

## 2019-07-13 DIAGNOSIS — E66813 Obesity, class 3: Secondary | ICD-10-CM

## 2019-07-13 NOTE — Progress Notes (Signed)
This patient was seen for a detailed fetal anatomy scan due to maternal morbid obesity, history of pregestational diabetes that is currently treated with Levemir and Humalog insulin, advanced maternal age, and history of chronic hypertension currently treated with labetalol 200 mg twice a day.  The patient reports that she was diagnosed with type 2 diabetes following her last delivery 4 years ago.  Her hemoglobin A1c at the time of conception was around 9%.  She denies any other significant past medical history and denies any problems in her current pregnancy.    Her cell free DNA test did not show any results due to an insufficient fetal DNA fraction.  She was informed that the fetal growth and amniotic fluid level were appropriate for her gestational age.   Due to extreme maternal body habitus, the views of the fetal anatomy were unable to be fully visualized today. The patient was informed that anomalies may be missed due to technical limitations. If the fetus is in a suboptimal position or maternal habitus is increased, visualization of the fetus in the maternal uterus may be impaired.  The implications and management of chronic hypertension in pregnancy was discussed. The patient was advised that should her blood pressures continue to be elevated, the dosage of her antihypertensive medications may need to be increased.  The increased risk of superimposed preeclampsia, an indicated preterm delivery, and possible fetal growth restriction due to chronic hypertension in pregnancy was discussed.   To decrease her risk of superimposed preeclampsia, she should continue taking a daily baby aspirin (81 mg daily) for preeclampsia prophylaxis.   The implications and management of diabetes in pregnancy was discussed in detail with the patient. She was advised that our goals for her fingerstick values are fasting values of 90-95 or less and two-hour postprandials of 120 or less.  Should her fingerstick values  be above these values, her insulin dosage may have to be adjusted to help her achieve better glycemic control. The patient was advised that getting her fingerstick values as close to these goals as possible would provide her with the most optimal obstetrical outcome.  She is already scheduled to have a fetal echocardiogram with pediatric cardiology on December 22.    The increased risk of fetal aneuploidy due to advanced maternal age was discussed. Due to advanced maternal age, the patient was offered and declined an amniocentesis today for definitive diagnosis of fetal aneuploidy.  The patient would like to have a repeat cell free DNA test drawn at her next visit with you.  We will await the final results from the repeat cell free DNA test for assessment of her risk of fetal aneuploidy.  Due to her underlying medical conditions, the patient was advised that we will continue to follow her closely throughout her pregnancy. We will continue to follow her with monthly growth scans. Weekly fetal testing should be started at around 32 weeks.  A follow-up exam was scheduled in 4 weeks to obtain better views of the fetal anatomy.  A total of 30 minutes was spent counseling and coordinating the care for this patient.  Greater than 50% of the time was spent in direct face-to-face contact.

## 2019-07-13 NOTE — Telephone Encounter (Signed)
-----   Message from Lavonia Drafts, MD sent at 07/13/2019 12:25 PM EST ----- Please ask pt to increase her Levimere insulin  at night from 34 to 38.   Thx,  Clh-S ----- Message ----- From: Doran Heater, RN Sent: 07/13/2019  11:55 AM EST To: Lavonia Drafts, MD  MON NOV 23 Show notes [id number] NOV 24 Show notes 97 121 128 117 WED NOV 25 Show notes 98 [place] 26 Thurs Nov 27 99 126 128 [place] 27 102 119 127 116  SAT NOV 28 96 [place] notes 97 119 125   MON NOV 30 96 115 120 [place] [id number] ----- Message ----- From: Lavonia Drafts, MD Sent: 07/13/2019  11:27 AM EST To: Doran Heater, RN  I dont see her glucose results.   Clh-S ----- Message ----- From: Doran Heater, RN Sent: 07/13/2019   9:49 AM EST To: Lavonia Drafts, MD   ----- Message ----- From: Donnamae Jude, MD Sent: 07/12/2019   4:59 PM EST To: [facility]  Her CBGs are still out of range--please have MD/APP to adjust her meds--see [facility].

## 2019-07-13 NOTE — Telephone Encounter (Signed)
Called patient and made her aware to increase her Levimere to 38 units at night . Patient states understanding and will increase.Kathrene Alu RN

## 2019-07-20 ENCOUNTER — Other Ambulatory Visit: Payer: Self-pay

## 2019-07-20 ENCOUNTER — Ambulatory Visit (INDEPENDENT_AMBULATORY_CARE_PROVIDER_SITE_OTHER): Payer: Medicaid Other | Admitting: Obstetrics & Gynecology

## 2019-07-20 VITALS — BP 134/63 | HR 92 | Wt 254.1 lb

## 2019-07-20 DIAGNOSIS — O09292 Supervision of pregnancy with other poor reproductive or obstetric history, second trimester: Secondary | ICD-10-CM

## 2019-07-20 DIAGNOSIS — Z3A19 19 weeks gestation of pregnancy: Secondary | ICD-10-CM

## 2019-07-20 DIAGNOSIS — O24119 Pre-existing diabetes mellitus, type 2, in pregnancy, unspecified trimester: Secondary | ICD-10-CM

## 2019-07-20 DIAGNOSIS — Z8632 Personal history of gestational diabetes: Secondary | ICD-10-CM

## 2019-07-20 DIAGNOSIS — O09529 Supervision of elderly multigravida, unspecified trimester: Secondary | ICD-10-CM

## 2019-07-20 DIAGNOSIS — O09299 Supervision of pregnancy with other poor reproductive or obstetric history, unspecified trimester: Secondary | ICD-10-CM

## 2019-07-20 DIAGNOSIS — O10919 Unspecified pre-existing hypertension complicating pregnancy, unspecified trimester: Secondary | ICD-10-CM

## 2019-07-20 DIAGNOSIS — Z3480 Encounter for supervision of other normal pregnancy, unspecified trimester: Secondary | ICD-10-CM

## 2019-07-20 DIAGNOSIS — O09522 Supervision of elderly multigravida, second trimester: Secondary | ICD-10-CM

## 2019-07-20 DIAGNOSIS — O24112 Pre-existing diabetes mellitus, type 2, in pregnancy, second trimester: Secondary | ICD-10-CM

## 2019-07-20 DIAGNOSIS — O10912 Unspecified pre-existing hypertension complicating pregnancy, second trimester: Secondary | ICD-10-CM

## 2019-07-20 DIAGNOSIS — Z98891 History of uterine scar from previous surgery: Secondary | ICD-10-CM

## 2019-07-20 NOTE — Patient Instructions (Signed)

## 2019-07-20 NOTE — Progress Notes (Signed)
   PRENATAL VISIT NOTE  Subjective:  Sandra Mason is a 38 y.o. G2P1001 at [redacted]w[redacted]d being seen today for ongoing prenatal care.  She is currently monitored for the following issues for this high-risk pregnancy and has Supervision of other normal pregnancy, antepartum; Hx of pre-eclampsia in prior pregnancy, currently pregnant, unspecified trimester; History of gestational diabetes mellitus (GDM); History of C-section; Class 3 severe obesity due to excess calories without serious comorbidity with body mass index (BMI) of 45.0 to 49.9 in adult [facility]); Chronic hypertension affecting pregnancy; AMA (advanced maternal age) multigravida 60+; and Type 2 diabetes mellitus affecting pregnancy, antepartum on their problem list.  Patient reports no complaints.  Contractions: Not present. Vag. Bleeding: None.  Movement: Present. Denies leaking of fluid.   The following portions of the patient's history were reviewed and updated as appropriate: allergies, current medications, past family history, past medical history, past social history, past surgical history and problem list.   Objective:   Vitals:   07/20/19 0934  BP: 134/63  Pulse: 92  Weight: 254 lb 1.9 oz (115.3 kg)    Fetal Status: Fetal Heart Rate (bpm): 152   Movement: Present     General:  Alert, oriented and cooperative. Patient is in no acute distress.  Skin: Skin is warm and dry. No rash noted.   Cardiovascular: Normal heart rate noted  Respiratory: Normal respiratory effort, no problems with respiration noted  Abdomen: Soft, gravid, appropriate for gestational age.  Pain/Pressure: Absent     Pelvic: Cervical exam deferred        Extremities: Normal range of motion.  Edema: None  Mental Status: Normal mood and affect. Normal behavior. Normal judgment and thought content.   Assessment and Plan:  Pregnancy: G2P1001 at [redacted]w[redacted]d 1. Supervision of other normal pregnancy, antepartum +FM  2. Type 2 diabetes mellitus affecting pregnancy,  antepartum Fasting glucose still elevated. All other levels are WNL Will increase Levimir at night from 34 to 38 units.    Keep Humolog at 20units in the am and 15 units in the pm Levimir 25 units in the am    3. Hx of pre-eclampsia in prior pregnancy, currently pregnant, unspecified trimester Keep baby asa  4. History of gestational diabetes mellitus (GDM)  5. History of C-section  6. Class 3 severe obesity due to excess calories without serious comorbidity with body mass index (BMI) of 45.0 to 49.9 in adult [facility]) Unable to measure FH. Will need to follow growth by [place]   7. Chronic hypertension affecting pregnancy Stable off meds.   8. Antepartum multigravida of advanced maternal age  Preterm labor symptoms and general obstetric precautions including but not limited to vaginal bleeding, contractions, leaking of fluid and fetal movement were reviewed in detail with the patient. Please refer to After Visit Summary for other counseling recommendations.   No follow-ups on file.  Future Appointments  Date Time Provider [facility]  08/13/2019  9:30 AM WH-MFC [place] 1 WH-MFCUS MFC-US  08/13/2019  9:40 AM WH-MFC NURSE WH-MFC MFC-US    Lavonia Drafts, MD

## 2019-08-09 ENCOUNTER — Encounter: Payer: Self-pay | Admitting: Obstetrics & Gynecology

## 2019-08-09 ENCOUNTER — Telehealth (INDEPENDENT_AMBULATORY_CARE_PROVIDER_SITE_OTHER): Payer: Medicaid Other | Admitting: Obstetrics & Gynecology

## 2019-08-09 VITALS — BP 136/85 | HR 96

## 2019-08-09 DIAGNOSIS — Z98891 History of uterine scar from previous surgery: Secondary | ICD-10-CM

## 2019-08-09 DIAGNOSIS — O09529 Supervision of elderly multigravida, unspecified trimester: Secondary | ICD-10-CM

## 2019-08-09 DIAGNOSIS — O09299 Supervision of pregnancy with other poor reproductive or obstetric history, unspecified trimester: Secondary | ICD-10-CM

## 2019-08-09 DIAGNOSIS — O24112 Pre-existing diabetes mellitus, type 2, in pregnancy, second trimester: Secondary | ICD-10-CM

## 2019-08-09 DIAGNOSIS — O09292 Supervision of pregnancy with other poor reproductive or obstetric history, second trimester: Secondary | ICD-10-CM

## 2019-08-09 DIAGNOSIS — Z3480 Encounter for supervision of other normal pregnancy, unspecified trimester: Secondary | ICD-10-CM

## 2019-08-09 DIAGNOSIS — Z3A22 22 weeks gestation of pregnancy: Secondary | ICD-10-CM

## 2019-08-09 DIAGNOSIS — O09522 Supervision of elderly multigravida, second trimester: Secondary | ICD-10-CM

## 2019-08-09 DIAGNOSIS — O10912 Unspecified pre-existing hypertension complicating pregnancy, second trimester: Secondary | ICD-10-CM

## 2019-08-09 DIAGNOSIS — O10919 Unspecified pre-existing hypertension complicating pregnancy, unspecified trimester: Secondary | ICD-10-CM

## 2019-08-09 DIAGNOSIS — O24119 Pre-existing diabetes mellitus, type 2, in pregnancy, unspecified trimester: Secondary | ICD-10-CM

## 2019-08-09 NOTE — Progress Notes (Signed)
Patient aware that 2nd panorama was insufficient cells. Kathrene Alu RN

## 2019-08-09 NOTE — Progress Notes (Signed)
TELEHEALTH OBSTETRICS PRENATAL VIRTUAL VIDEO VISIT ENCOUNTER NOTE  Provider location: [facility] [address]   I connected with Sandra Mason on 08/09/19 at  9:30 AM EST by MyChart Video Encounter at home and verified that I am speaking with the correct person using two identifiers.   I discussed the limitations, risks, security and privacy concerns of performing an evaluation and management service virtually and the availability of in person appointments. I also discussed with the patient that there may be a patient responsible charge related to this service. The patient expressed understanding and agreed to proceed. Subjective:  Sandra Mason is a 38 y.o. G2P1001 at [redacted]w[redacted]d being seen today for ongoing prenatal care.  She is currently monitored for the following issues for this high-risk pregnancy and has Supervision of other normal pregnancy, antepartum; Hx of pre-eclampsia in prior pregnancy, currently pregnant, unspecified trimester; History of gestational diabetes mellitus (GDM); History of C-section; Class 3 severe obesity due to excess calories without serious comorbidity with body mass index (BMI) of 45.0 to 49.9 in adult [facility]); Chronic hypertension affecting pregnancy; AMA (advanced maternal age) multigravida 64+; and Type 2 diabetes mellitus affecting pregnancy, antepartum on their problem list.  Patient reports no complaints.  Contractions: Not present. Vag. Bleeding: None.  Movement: Present. Denies any leaking of fluid.   The following portions of the patient's history were reviewed and updated as appropriate: allergies, current medications, past family history, past medical history, past social history, past surgical history and problem list.   Objective:   Vitals:   08/09/19 0922  BP: 136/85  Pulse: 96    Fetal Status:     Movement: Present     General:  Alert, oriented and cooperative. Patient is in no acute distress.  Respiratory: Normal  respiratory effort, no problems with respiration noted  Mental Status: Normal mood and affect. Normal behavior. Normal judgment and thought content.  Rest of physical exam deferred due to type of encounter  Imaging: [place] MFM OB DETAIL +14 WK  Result Date: 07/13/2019 ----------------------------------------------------------------------  OBSTETRICS REPORT                       (Signed Final 07/13/2019 11:22 am) ---------------------------------------------------------------------- Patient Info  ID #:       [id number]                          D.O.B.:  05-Jun-1981 (38 yrs)  Name:       Sandra Mason                 Visit Date: 07/13/2019 09:47 am ---------------------------------------------------------------------- Performed By  Performed By:     Novella Rob        Ref. Address:     [address] [place] [address]  Attending:        Johnell Comings MD         [facility]  Referred By:      Lavonia Drafts MD ---------------------------------------------------------------------- Orders   #  Description                          Code         Ordered By   1  [place] MFM OB DETAIL +14 WK              76811.01     Lavonia Drafts  ----------------------------------------------------------------------   #  Order #                    Accession #                 Episode #   1  [id number]                  [id number]                  [id number]  ---------------------------------------------------------------------- Indications   Pre-existing diabetes, type 2, in pregnancy,   O24.112   second trimester (insulin)   Maternal morbid obesity BMI 76                 O99.210 E66.01    Hypertension - Chronic/Pre-existing            O10.019   (labetalol)   Advanced maternal age multigravida 3+,        O81.522   second trimester   Encounter for antenatal screening for          Z36.3   malformations   [redacted] weeks gestation of pregnancy                Z3A.18  ---------------------------------------------------------------------- Vital Signs  Weight (lb): 254                               Height:        5'1"  BMI:         47.99 ---------------------------------------------------------------------- Fetal Evaluation  Num Of Fetuses:         1  Fetal Heart Rate(bpm):  162  Cardiac Activity:       Observed  Presentation:           Cephalic  Placenta:  Posterior  P. Cord Insertion:      Visualized  Amniotic Fluid  AFI FV:      Within normal limits                              Largest Pocket(cm)                              3.49 ---------------------------------------------------------------------- Biometry  BPD:      37.9  mm     G. Age:  17w 4d          9  %    CI:        72.48   %    70 - 86                                                          FL/HC:      19.6   %    16.1 - 18.3  HC:      141.6  mm     G. Age:  17w 3d          3  %    HC/AC:      1.10        1.09 - 1.39  AC:      128.6  mm     G. Age:  18w 3d         51  %    FL/BPD:     73.1   %  FL:       27.7  mm     G. Age:  18w 3d         34  %    FL/AC:      21.5   %    20 - 24  CER:      18.4  mm     G. Age:  18w 1d         33  %  CM:        2.8  mm  Est. FW:     234  gm      0 lb 8 oz     24  % ---------------------------------------------------------------------- OB History  Gravidity:    2         Term:   1  Living:       1 ---------------------------------------------------------------------- Gestational Age  LMP:           18w 5d        Date:  03/04/19                 EDD:   12/09/19  U/S Today:     18w 0d                                        EDD:   12/14/19  Best:          18w 5d     Det. By:  LMP  (03/04/19)          EDD:    12/09/19 ---------------------------------------------------------------------- Anatomy  Cranium:  Appears normal         Aortic Arch:            Previously seen  Cavum:                 Appears normal         Ductal Arch:            Previously seen  Ventricles:            Not well visualized    Diaphragm:              Not well visualized  Choroid Plexus:        Appears normal         Stomach:                Appears normal, left                                                                        sided  Cerebellum:            Appears normal         Abdomen:                Appears normal  Posterior Fossa:       Not well visualized    Abdominal Wall:         Not well visualized  Nuchal Fold:           Not well visualized    Cord Vessels:           Appears normal (3                                                                        vessel cord)  Face:                  Not well visualized    Kidneys:                Appear normal  Lips:                  Not well visualized    Bladder:                Appears normal  Heart:                 Not well visualized    Spine:                  Ltd views no                                                                        intracranial signs of  NTD  RVOT:                  Not well visualized    Upper Extremities:      Visualized  LVOT:                  Not well visualized    Lower Extremities:      Appears normal  Other:  Female gender ---------------------------------------------------------------------- Cervix Uterus Adnexa  Cervix  Length:           4.05  cm.  Not visualized (advanced GA >24wks) ---------------------------------------------------------------------- Comments  This patient was seen for a detailed fetal anatomy scan due  to maternal morbid obesity, history of pregestational diabetes  that is currently treated with Levemir and Humalog insulin,  advanced maternal age, and  history of chronic hypertension  currently treated with labetalol 200 mg twice a day.  The  patient reports that she was diagnosed with type 2 diabetes  following her last delivery 4 years ago.  Her hemoglobin A1c  at the time of conception was around 9%.  She denies any other significant past medical history and  denies any problems in her current pregnancy.  Her cell free DNA test did not show any results due to an  insufficient fetal DNA fraction.  She was informed that the fetal growth and amniotic fluid  level were appropriate for her gestational age.  Due to extreme maternal body habitus, the views of the fetal  anatomy were unable to be fully visualized today.  The patient was informed that anomalies may be missed due  to technical limitations. If the fetus is in a suboptimal position  or maternal habitus is increased, visualization of the fetus in  the maternal uterus may be impaired.  The implications and management of chronic hypertension in  pregnancy was discussed. The patient was advised that  should her blood pressures continue to be elevated, the  dosage of her antihypertensive medications may need to be  increased.  The increased risk of superimposed  preeclampsia, an indicated preterm delivery, and possible  fetal growth restriction due to chronic hypertension in  pregnancy was discussed.  To decrease her risk of superimposed preeclampsia, she  should continue taking a daily baby aspirin (81 mg daily) for  preeclampsia prophylaxis.  The implications and management of diabetes in pregnancy  was discussed in detail with the patient. She was advised that  our goals for her fingerstick values are fasting values of 90-95  or less and two-hour postprandials of 120 or less.  Should her  fingerstick values be above these values, her insulin dosage  may have to be adjusted to help her achieve better glycemic  control. The patient was advised that getting her fingerstick  values as close to these goals as  possible would provide her  with the most optimal obstetrical outcome.  She is already scheduled to have a fetal echocardiogram with  pediatric cardiology on December 22.  The increased risk of fetal aneuploidy due to advanced  maternal age was discussed. Due to advanced maternal age,  the patient was offered and declined an amniocentesis today  for definitive diagnosis of fetal aneuploidy.  The patient would  like to have a repeat cell free DNA test drawn at her next visit  with you.  We will await the final results from the repeat cell  free DNA test for assessment of her risk of fetal aneuploidy.  Due to her underlying medical conditions,  the patient was  advised that we will continue to follow her closely throughout  her pregnancy. We will continue to follow her with monthly  growth scans. Weekly fetal testing should be started at  around 32 weeks.  A follow-up exam was scheduled in 4 weeks to obtain better  views of the fetal anatomy.  A total of 30 minutes was spent counseling and coordinating  the care for this patient.  Greater than 50% of the time was  spent in direct face-to-face contact. ----------------------------------------------------------------------                   Johnell Comings, MD Electronically Signed Final Report   07/13/2019 11:22 am ----------------------------------------------------------------------   Assessment and Plan:  Pregnancy: G2P1001 at [redacted]w[redacted]d 1. Chronic hypertension affecting pregnancy 136/85 routinely  Taking baby ASA and Labetalol 400mg  bid   2. Supervision of other normal pregnancy, antepartum Has f/u appt with MFM. Will make appt with genetic counselor for insufficient  cells for NIPS.   3. Hx of pre-eclampsia in prior pregnancy, currently pregnant, unspecified trimester On baby ASA see above.   4. Type 2 diabetes mellitus affecting pregnancy, antepartum Levimir 25u q am and 38 u q pm Humalog 20u in am and 15 u lunch and dinner  fasting highest 94 2 hour  highest 123  5. History of C-section  6. Class 3 severe obesity due to excess calories without serious comorbidity with body mass index (BMI) of 45.0 to 49.9 in adult ([place])  7. Antepartum multigravida of advanced maternal age  Preterm labor symptoms and general obstetric precautions including but not limited to vaginal bleeding, contractions, leaking of fluid and fetal movement were reviewed in detail with the patient. I discussed the assessment and treatment plan with the patient. The patient was provided an opportunity to ask questions and all were answered. The patient agreed with the plan and demonstrated an understanding of the instructions. The patient was advised to call back or seek an in-person office evaluation/go to [facility] at [facility] for any urgent or concerning symptoms. Please refer to After Visit Summary for other counseling recommendations.   I provided 12 minutes of face-to-face time during this encounter.  Return in about 4 weeks (around 09/06/2019) for in person.  Future Appointments  Date Time Provider [place]  08/13/2019  9:30 AM WH-MFC [place] 1 WH-MFCUS MFC-US  08/13/2019  9:40 AM [place] MFC-US    Lavonia Drafts, MD [facility] [place]

## 2019-08-13 ENCOUNTER — Ambulatory Visit (HOSPITAL_COMMUNITY): Payer: Medicaid Other | Admitting: *Deleted

## 2019-08-13 ENCOUNTER — Other Ambulatory Visit: Payer: Self-pay

## 2019-08-13 ENCOUNTER — Other Ambulatory Visit: Payer: Self-pay | Admitting: Family Medicine

## 2019-08-13 ENCOUNTER — Other Ambulatory Visit (HOSPITAL_COMMUNITY): Payer: Self-pay | Admitting: *Deleted

## 2019-08-13 ENCOUNTER — Ambulatory Visit (HOSPITAL_COMMUNITY)
Admission: RE | Admit: 2019-08-13 | Discharge: 2019-08-13 | Disposition: A | Discharge status: Home / Self Care | Payer: Medicaid Other | Source: Ambulatory Visit | Attending: Obstetrics and Gynecology | Admitting: Obstetrics and Gynecology

## 2019-08-13 ENCOUNTER — Encounter (HOSPITAL_COMMUNITY): Payer: Self-pay | Admitting: *Deleted

## 2019-08-13 DIAGNOSIS — Z3480 Encounter for supervision of other normal pregnancy, unspecified trimester: Secondary | ICD-10-CM | POA: Diagnosis present

## 2019-08-13 DIAGNOSIS — O10919 Unspecified pre-existing hypertension complicating pregnancy, unspecified trimester: Secondary | ICD-10-CM | POA: Diagnosis present

## 2019-08-13 DIAGNOSIS — O24414 Gestational diabetes mellitus in pregnancy, insulin controlled: Secondary | ICD-10-CM | POA: Diagnosis present

## 2019-08-13 DIAGNOSIS — O09522 Supervision of elderly multigravida, second trimester: Secondary | ICD-10-CM

## 2019-08-13 DIAGNOSIS — O09299 Supervision of pregnancy with other poor reproductive or obstetric history, unspecified trimester: Secondary | ICD-10-CM | POA: Diagnosis present

## 2019-08-13 DIAGNOSIS — O10012 Pre-existing essential hypertension complicating pregnancy, second trimester: Secondary | ICD-10-CM

## 2019-08-13 DIAGNOSIS — O99212 Obesity complicating pregnancy, second trimester: Secondary | ICD-10-CM

## 2019-08-13 DIAGNOSIS — O24112 Pre-existing diabetes mellitus, type 2, in pregnancy, second trimester: Secondary | ICD-10-CM | POA: Diagnosis not present

## 2019-08-13 DIAGNOSIS — Z3A23 23 weeks gestation of pregnancy: Secondary | ICD-10-CM

## 2019-08-13 MED ORDER — LABETALOL HCL 200 MG PO TABS: 400.0000 mg | ORAL_TABLET | Freq: Two times a day (BID) | ORAL | 3 refills | Status: DC

## 2019-08-14 ENCOUNTER — Other Ambulatory Visit: Payer: Self-pay | Admitting: Obstetrics & Gynecology

## 2019-08-14 ENCOUNTER — Ambulatory Visit (HOSPITAL_COMMUNITY): Payer: Self-pay | Admitting: Genetic Counselor

## 2019-08-14 ENCOUNTER — Other Ambulatory Visit: Payer: Self-pay

## 2019-08-14 ENCOUNTER — Ambulatory Visit (HOSPITAL_COMMUNITY): Payer: Medicaid Other | Attending: Obstetrics and Gynecology | Admitting: Genetic Counselor

## 2019-08-14 DIAGNOSIS — Z315 Encounter for genetic counseling: Secondary | ICD-10-CM

## 2019-08-14 DIAGNOSIS — O09522 Supervision of elderly multigravida, second trimester: Secondary | ICD-10-CM | POA: Diagnosis not present

## 2019-08-14 DIAGNOSIS — Z3A23 23 weeks gestation of pregnancy: Secondary | ICD-10-CM | POA: Diagnosis not present

## 2019-08-14 MED ORDER — LABETALOL HCL 200 MG PO TABS: 400.0000 mg | ORAL_TABLET | Freq: Two times a day (BID) | ORAL | 4 refills | Status: DC

## 2019-08-14 NOTE — Progress Notes (Signed)
08/14/2019  [name] [birth year] MRN: [id number] DOV: 08/14/2019  Sandra Mason presented to the [facility] for a genetics consultation regarding advanced maternal age and low fetal fraction on noninvasive prenatal screening (NIPS). Sandra Mason came to her appointment alone due to COVID-19 visitor restrictions. Second year [employer] genetic counseling student Loralyn Freshwater assisted in this session under my supervision with the patient's verbal consent.  Indication for genetic counseling - Advanced maternal age - Low fetal fraction on noninvasive prenatal screening (NIPS)  Prenatal history  Sandra Mason is a G49P1001, 40 y.o. female. Her current pregnancy has completed [redacted]w[redacted]d (Estimated Date of Delivery: 12/09/19).  Sandra Mason denied exposure to environmental toxins or chemical agents. She denied the use of alcohol, tobacco or street drugs. She reported taking Levemir, Humalog, Labetalol, prenatal vitamins, vitamin C, and aspirin. She denied significant viral illnesses, fevers, and bleeding during the course of her pregnancy. She has diabetes and hypertension. Her medical and surgical histories are otherwise noncontributory.  Family History  A three generation pedigree was drafted and reviewed. The family history is remarkable for the following:  - Sandra Mason has a paternal family history of cancer. Her father passed away from lung cancer at the age of 46. She had three paternal uncles who died of liver cancer. She had two maternal aunts who died of breast cancer in their 60s and 54s. She also has another maternal aunt with lung, liver, or stomach cancer that was diagnosed in her 36s or 38s. Though most cancers are thought to be sporadic or due to environmental factors, some families appear to have a strong predisposition to cancers.  When considering a family history of cancer, we look for common types of cancer in multiple family  members occurring at younger than typical ages. Given that Sandra Mason's family members all had cancer over the age of 79, it is unlikely that there is an inherited cancer syndrome in the family. However, if she is concerned about the family history of cancer and would like to learn more about the family's chance for an inherited cancer syndrome, her healthcare provider may refer her or her relatives to genetic counseling at the [facility] [phone]).   - The mother of Sandra Mason's partner, James Ivanoff, had four miscarriages in the first trimester. She also has two sons and two daughters. We reviewed that there are many potential causes of recurrent miscarriages, including anatomic, immunologic, endocrine, and genetic factors. However, a cause is only identified in 50% of individuals who experience recurrent miscarriages. Abnormalities of chromosome number or structure are the most common cause of sporadic early pregnancy loss. A significant proportion of recurrent miscarriages may also be associated with structural or numerical chromosome abnormalities. We discussed that 2-5% of couples who experience multiple miscarriages carry a balanced translocation that can become unbalanced and potentially lethal in their offspring. In addition to chromosomal abnormalities, certain single gene, X-linked, and polygenic/multifactorial disorders are associated with recurrent miscarriage.   The remaining family histories were reviewed and found to be noncontributory for birth defects, intellectual disability, recurrent pregnancy loss, and known genetic conditions.    The patient's ethnicity is [place] The father of the pregnancy's ethnicity is Caucasian. Ashkenazi Jewish ancestry and consanguinity were denied. Pedigree will be scanned under Media.  Discussion  Ms. Quinonezwas referred to genetic counseling for advanced maternal age, as she will be6years old at the time of delivery. We  discussed that as a woman  ages, the risk for certain chromosomal conditions, such astrisomy 54 (Down syndrome), trisomy 61, and trisomy 18 increases. These conditions often are not inherited, but insteadoccur due toan error in chromosomal division during the formation of sperm and eggcells in a process called nondisjunction. At Sandra Mason's age and during the secondtrimester, there is approximately a 1 in 44 (1.1%) chance of having a child with a chromosomal abnormality. Her age-related risk to have a child with Down syndrome specifically is 1 in 178 (0.6%) in the second trimester. We brieflyreviewedfeatures associated with Down syndrome, trisomy 79, and trisomy 1.  Sandra Mason had [facility] noninvasive prenatal screening (NIPS) performed through the laboratory [facility] to assess for the risk of chromosomal aneuploidies in the current pregnancy. She received no result for her first blood sample due to insufficient fetal DNA, with a fetal fraction of 2.5%. Her second blood sample also failed due to insufficient fetal DNA, with a fetal fraction of 3.3%.    [facility]ama NIPS utilizes single nucleotide polymorphisms (SNPs) to distinguish maternal from fetal (placental) DNA. The term "fetal fraction" refers to the amount of sample that is believed to have come from fetal DNA rather than maternal DNA. We reviewed that there are many possible reasons a sample may have a low fetal fraction, including early gestational age, high maternal BMI, suboptimal sample collection, maternal use of medications like low molecular weight heparin, pregnancy loss, pregnancy complications, and normal variation. Low fetal fraction is also associated with aneuploidy in 20-30% of cases. Pregnancies affected by triploidy, trisomy 43, and trisomy 4 have all been associated with low fetal fraction on NIPS; however, there is no increased incidence of trisomy 29 or monosomy X in cases with low fetal fraction. It is thought that  pregnancies with triploidy, trisomy 2, and trisomy 18 may have smaller placentas, which could result in an insufficient fetal fraction. We briefly reviewed that triploidy, trisomy 57, and trisomy 7 are lethal conditions that impact various organ systems throughout the body. Fetuses with triploidy, trisomy 78, and trisomy 79 often pass away during pregnancy; affected infants often pass away very shortly after birth, and most do not survive beyond 38 year of age.  After her two failed NIPS samples, Sandra Mason had quad screening performed which was negative. We reviewed that the risk for her fetus to be affected by Down syndrome decreased from her age-related risk to 1 in 61, and the risk for trisomy 18 was not increased over her age-related risk based on the results of this screen. Additionally, quad screen results were negative for open neural tube defects (ONTD). While this screen significantly reduces the likelihood of the pregnancy being affected by trisomy 21, trisomy 66, or ONTDs, it cannot be considered diagnostic. Additionally, we reviewed that quad screening has other limitations. Quad screens cannot assess for trisomy 15, triploidy, or sex chromosome abnormalities like NIPS can. Quadscreening is also less accurate than NIPS, detecting 75-80% of cases of Down syndrome, 73% of cases of trisomy 18, and 80-90% of ONTDs as opposed to a detection rate of 91-99% for trisomies 21, 18, 79, triploidy, and sex chromosome aneuploidies with NIPS.  A complete ultrasound was completed prior to our visit. The ultrasound report will be sent under separate cover. There were no visualized fetal anomalies or markers suggestive of aneuploidy. Sandra Mason was counseled that ~50% of fetuses with Down syndrome and 90-95% of fetuses with trisomy 32, trisomy 4, or triploidy demonstrate a sign of the respective conditions on anatomy ultrasound. For this reason,  a normal ultrasound decreases the suspicion for trisomy 2,  trisomy 61, trisomy 23, or triploidy in the pregnancy.  We discussed additional screening/testing options. Firstly, redrawing a new sample for NIPS is possible. If the redraw was successful and contained a sufficient fetal fraction, results could clarify the risks for chromosomal aneuploidy in the current pregnancy.Sandra Mason was counseled that we could pursue NIPS through a different laboratory that is able to analyze samples with lower fetal fraction. However, there is still a chance that a new sample may not contain a sufficient fetal fraction. Additionally, insurance may not approve a third sample redraw for NIPS.   Sandra Mason was also counseled regarding diagnostic testing via amniocentesis. We discussed the technical aspects of the procedure and quoted up to a 1 in 500 (0.2%) risk for spontaneous pregnancy loss or other adverse pregnancy outcomes as a result of amniocentesis. Cultured cells from an amniocentesis sample allow for the visualization of a fetal karyotype, which can detect >99% of chromosomal aberrations. Chromosomal microarray can also be performed to identify smaller deletions or duplications of fetal chromosomal material. Sandra Mason was informed that amniocentesis is the only testing option that can determine a fetuses's chromosomal aneuploidy status with certainty prenatally.  After careful consideration, Sandra Mason declined additional NIPS analysis and amniocentesis at this time. She felt reassured by her normal quad screen results and normal ultrasound findings and did not wish to undergo any additional aneuploidy screening/testing for the pregnancy. She understands that NIPS and amniocentesis are available through the end of pregnancy and that she may opt to undergo either option at a later date should she change her mind.  Finally, Sandra Mason previously had normal carrier screening for hemoglobinopathies, cystic fibrosis (CF), and spinal muscular atrophy (SMA). Ms.  Sandra Mason was negative for the mutations analyzed in the CFTR gene associated with CF. Based on this negative result and her ethnic background, she has a 1 in 207 residual risk of being a carrier for CF. Sandra Mason was also identified to have 2 copies of the SMN1 gene associated with SMA and was negative for the c. *3+80T>G SNP. Based on this negative result and her ethnic background, she has a 1 in 375 residual risk of being a carrier for SMA. Sandra Mason also had a normal hemoglobin electrophoresis, significantly reducing her risk of her being a carrier for hemoglobinopathies such as sickle cell disease. All of Sandra Mason's negative carrier screening results significantly decrease the chance of her fetus being affected by one of these conditions.   Additional screening and diagnostic testing were declined today. Sandra Mason preferred to wait until the postnatal period to pursue any further genetic testing if clinically indicated. She understands that screening tests, including ultrasound, cannot rule out all birth defects or genetic syndromes. The patient was advised of this limitation and states she still does not want additional testing or screening at this time.   I counseled Sandra Mason regarding the above risks and available options. The approximate face-to-face time with the genetic counselor was 25 minutes.  In summary:  Discussed advanced maternal age and options for follow-up testing  NIPS failed two times due to insufficient fetal fraction  Quad screen was low risk for trisomy 54, trisomy 42, and ONTDs  Declined redraw for NIPS  Declined amniocentesis  Previously had negative carrier screening for cystic fibrosis, spinal muscular atrophy, and hemoglobinopathies  Reduction of risk to have child affected by one of these conditions  Reviewed results of ultrasound  No  fetal anomalies or markers seen  Reduction in risk for fetal aneuploidy  Reviewed family history  concerns   Buelah Manis, [place]

## 2019-08-30 ENCOUNTER — Encounter: Payer: Medicaid Other | Admitting: Family Medicine

## 2019-08-31 ENCOUNTER — Encounter: Payer: Self-pay | Admitting: Obstetrics and Gynecology

## 2019-08-31 NOTE — Progress Notes (Signed)
Reviewed data from [employer], patient has not been logging CBGs regularly. MyChart message sent asking patient to make sure she is checking CBGs and logging them into app.    Feliz Beam, M.D. Attending [facility] for [employer] farm manager)

## 2019-09-07 ENCOUNTER — Ambulatory Visit (INDEPENDENT_AMBULATORY_CARE_PROVIDER_SITE_OTHER): Payer: Medicaid Other | Admitting: Obstetrics & Gynecology

## 2019-09-07 VITALS — BP 146/85 | HR 109 | Wt 264.0 lb

## 2019-09-07 DIAGNOSIS — O09292 Supervision of pregnancy with other poor reproductive or obstetric history, second trimester: Secondary | ICD-10-CM

## 2019-09-07 DIAGNOSIS — Z3480 Encounter for supervision of other normal pregnancy, unspecified trimester: Secondary | ICD-10-CM

## 2019-09-07 DIAGNOSIS — O24112 Pre-existing diabetes mellitus, type 2, in pregnancy, second trimester: Secondary | ICD-10-CM

## 2019-09-07 DIAGNOSIS — Z3A26 26 weeks gestation of pregnancy: Secondary | ICD-10-CM

## 2019-09-07 DIAGNOSIS — O09529 Supervision of elderly multigravida, unspecified trimester: Secondary | ICD-10-CM

## 2019-09-07 DIAGNOSIS — O09522 Supervision of elderly multigravida, second trimester: Secondary | ICD-10-CM

## 2019-09-07 DIAGNOSIS — Z98891 History of uterine scar from previous surgery: Secondary | ICD-10-CM

## 2019-09-07 DIAGNOSIS — O24119 Pre-existing diabetes mellitus, type 2, in pregnancy, unspecified trimester: Secondary | ICD-10-CM

## 2019-09-07 DIAGNOSIS — O10912 Unspecified pre-existing hypertension complicating pregnancy, second trimester: Secondary | ICD-10-CM

## 2019-09-07 DIAGNOSIS — O10919 Unspecified pre-existing hypertension complicating pregnancy, unspecified trimester: Secondary | ICD-10-CM

## 2019-09-07 DIAGNOSIS — O09299 Supervision of pregnancy with other poor reproductive or obstetric history, unspecified trimester: Secondary | ICD-10-CM

## 2019-09-07 DIAGNOSIS — Z8632 Personal history of gestational diabetes: Secondary | ICD-10-CM

## 2019-09-07 NOTE — Progress Notes (Signed)
   PRENATAL VISIT NOTE  Subjective:  Sandra Mason is a 39 y.o. G2P1001 at [redacted]w[redacted]d being seen today for ongoing prenatal care.  She is currently monitored for the following issues for this high-risk pregnancy and has Supervision of other normal pregnancy, antepartum; Hx of pre-eclampsia in prior pregnancy, currently pregnant, unspecified trimester; History of gestational diabetes mellitus (GDM); History of C-section; Class 3 severe obesity due to excess calories without serious comorbidity with body mass index (BMI) of 45.0 to 49.9 in adult [facility]); Chronic hypertension affecting pregnancy; AMA (advanced maternal age) multigravida 17+; and Type 2 diabetes mellitus affecting pregnancy, antepartum on their problem list.  Patient reports some pain in her right groin. Had this in prior pregnancy as well. Resolves with rest. .  Contractions: Not present. Vag. Bleeding: None.  Movement: Present. Denies leaking of fluid.   The following portions of the patient's history were reviewed and updated as appropriate: allergies, current medications, past family history, past medical history, past social history, past surgical history and problem list.   Objective:   Vitals:   09/07/19 1039  BP: (!) 146/85  Pulse: (!) 109  Weight: 264 lb (119.7 kg)    Fetal Status: Fetal Heart Rate (bpm): 148   Movement: Present     General:  Alert, oriented and cooperative. Patient is in no acute distress.  Skin: Skin is warm and dry. No rash noted.   Cardiovascular: Normal heart rate noted  Respiratory: Normal respiratory effort, no problems with respiration noted  Abdomen: Soft, gravid, appropriate for gestational age.  Pain/Pressure: Absent     Pelvic: Cervical exam deferred        Extremities: Normal range of motion.  Edema: None  Mental Status: Normal mood and affect. Normal behavior. Normal judgment and thought content.   Assessment and Plan:  Pregnancy: G2P1001 at [redacted]w[redacted]d 1. Supervision of other normal  pregnancy, antepartum  2. Type 2 diabetes mellitus affecting pregnancy, antepartum A few of the fasting glucose are elevated. They are otherwise WNL   3. Hx of pre-eclampsia in prior pregnancy, currently pregnant, unspecified trimester 08/13/2019  Est. FW:     477  gm      1 lb 1 oz      8  %  4. History of gestational diabetes mellitus (GDM)  5. History of C-section Discuss method of delivery at next visit.   6. Class 3 severe obesity due to excess calories without serious comorbidity with body mass index (BMI) of 45.0 to 49.9 in adult ([place])  7. Chronic hypertension affecting pregnancy On baby ASA and Labetolol  8. Antepartum multigravida of advanced maternal age   Preterm labor symptoms and general obstetric precautions including but not limited to vaginal bleeding, contractions, leaking of fluid and fetal movement were reviewed in detail with the patient. Please refer to After Visit Summary for other counseling recommendations.   No follow-ups on file.  Future Appointments  Date Time Provider [place]  09/10/2019  1:10 PM [place] [facility]  09/10/2019  1:15 PM [place] [id number]    Lavonia Drafts, MD

## 2019-09-07 NOTE — Patient Instructions (Signed)
Third Trimester of Pregnancy The third trimester is from week 28 through week 40 (months 7 through 9). The third trimester is a time when the unborn baby (fetus) is growing rapidly. At the end of the ninth month, the fetus is about 20 inches in length and weighs 6-10 pounds. Body changes during your third trimester Your body will continue to go through many changes during pregnancy. The changes vary from woman to woman. During the third trimester:  Your weight will continue to increase. You can expect to gain 25-35 pounds (11-16 kg) by the end of the pregnancy.  You may begin to get stretch marks on your hips, abdomen, and breasts.  You may urinate more often because the fetus is moving lower into your pelvis and pressing on your bladder.  You may develop or continue to have heartburn. This is caused by increased hormones that slow down muscles in the digestive tract.  You may develop or continue to have constipation because increased hormones slow digestion and cause the muscles that push waste through your intestines to relax.  You may develop hemorrhoids. These are swollen veins (varicose veins) in the rectum that can itch or be painful.  You may develop swollen, bulging veins (varicose veins) in your legs.  You may have increased body aches in the pelvis, back, or thighs. This is due to weight gain and increased hormones that are relaxing your joints.  You may have changes in your hair. These can include thickening of your hair, rapid growth, and changes in texture. Some women also have hair loss during or after pregnancy, or hair that feels dry or thin. Your hair will most likely return to normal after your baby is born.  Your breasts will continue to grow and they will continue to become tender. A yellow fluid (colostrum) may leak from your breasts. This is the first milk you are producing for your baby.  Your belly button may stick out.  You may notice more swelling in your hands,  face, or ankles.  You may have increased tingling or numbness in your hands, arms, and legs. The skin on your belly may also feel numb.  You may feel short of breath because of your expanding uterus.  You may have more problems sleeping. This can be caused by the size of your belly, increased need to urinate, and an increase in your body's metabolism.  You may notice the fetus "dropping," or moving lower in your abdomen (lightening).  You may have increased vaginal discharge.  You may notice your joints feel loose and you may have pain around your pelvic bone. What to expect at prenatal visits You will have prenatal exams every 2 weeks until week 36. Then you will have weekly prenatal exams. During a routine prenatal visit:  You will be weighed to make sure you and the baby are growing normally.  Your blood pressure will be taken.  Your abdomen will be measured to track your baby's growth.  The fetal heartbeat will be listened to.  Any test results from the previous visit will be discussed.  You may have a cervical check near your due date to see if your cervix has softened or thinned (effaced).  You will be tested for Group B streptococcus. This happens between 35 and 37 weeks. Your health care provider may ask you:  What your birth plan is.  How you are feeling.  If you are feeling the baby move.  If you have had any abnormal   symptoms, such as leaking fluid, bleeding, severe headaches, or abdominal cramping.  If you are using any tobacco products, including cigarettes, chewing tobacco, and electronic cigarettes.  If you have any questions. Other tests or screenings that may be performed during your third trimester include:  Blood tests that check for low iron levels (anemia).  Fetal testing to check the health, activity level, and growth of the fetus. Testing is done if you have certain medical conditions or if there are problems during the pregnancy.  Nonstress test  (NST). This test checks the health of your baby to make sure there are no signs of problems, such as the baby not getting enough oxygen. During this test, a belt is placed around your belly. The baby is made to move, and its heart rate is monitored during movement. What is false labor? False labor is a condition in which you feel small, irregular tightenings of the muscles in the womb (contractions) that usually go away with rest, changing position, or drinking water. These are called Braxton Hicks contractions. Contractions may last for hours, days, or even weeks before true labor sets in. If contractions come at regular intervals, become more frequent, increase in intensity, or become painful, you should see your health care provider. What are the signs of labor?  Abdominal cramps.  Regular contractions that start at 10 minutes apart and become stronger and more frequent with time.  Contractions that start on the top of the uterus and spread down to the lower abdomen and back.  Increased pelvic pressure and dull back pain.  A watery or bloody mucus discharge that comes from the vagina.  Leaking of amniotic fluid. This is also known as your "water breaking." It could be a slow trickle or a gush. Let your health care provider know if it has a color or strange odor. If you have any of these signs, call your health care provider right away, even if it is before your due date. Follow these instructions at home: Medicines  Follow your health care provider's instructions regarding medicine use. Specific medicines may be either safe or unsafe to take during pregnancy.  Take a prenatal vitamin that contains at least 600 micrograms (mcg) of folic acid.  If you develop constipation, try taking a stool softener if your health care provider approves. Eating and drinking   Eat a balanced diet that includes fresh fruits and vegetables, whole grains, good sources of protein such as meat, eggs, or tofu,  and low-fat dairy. Your health care provider will help you determine the amount of weight gain that is right for you.  Avoid raw meat and uncooked cheese. These carry germs that can cause birth defects in the baby.  If you have low calcium intake from food, talk to your health care provider about whether you should take a daily calcium supplement.  Eat four or five small meals rather than three large meals a day.  Limit foods that are high in fat and processed sugars, such as fried and sweet foods.  To prevent constipation: ? Drink enough fluid to keep your urine clear or pale yellow. ? Eat foods that are high in fiber, such as fresh fruits and vegetables, whole grains, and beans. Activity  Exercise only as directed by your health care provider. Most women can continue their usual exercise routine during pregnancy. Try to exercise for 30 minutes at least 5 days a week. Stop exercising if you experience uterine contractions.  Avoid heavy lifting.  Do   not exercise in extreme heat or humidity, or at high altitudes.  Wear low-heel, comfortable shoes.  Practice good posture.  You may continue to have sex unless your health care provider tells you otherwise. Relieving pain and discomfort  Take frequent breaks and rest with your legs elevated if you have leg cramps or low back pain.  Take warm sitz baths to soothe any pain or discomfort caused by hemorrhoids. Use hemorrhoid cream if your health care provider approves.  Wear a good support bra to prevent discomfort from breast tenderness.  If you develop varicose veins: ? Wear support pantyhose or compression stockings as told by your healthcare provider. ? Elevate your feet for 15 minutes, 3-4 times a day. Prenatal care  Write down your questions. Take them to your prenatal visits.  Keep all your prenatal visits as told by your health care provider. This is important. Safety  Wear your seat belt at all times when driving.  Make  a list of emergency phone numbers, including numbers for family, friends, the hospital, and police and fire departments. General instructions  Avoid cat litter boxes and soil used by cats. These carry germs that can cause birth defects in the baby. If you have a cat, ask someone to clean the litter box for you.  Do not travel far distances unless it is absolutely necessary and only with the approval of your health care provider.  Do not use hot tubs, steam rooms, or saunas.  Do not drink alcohol.  Do not use any products that contain nicotine or tobacco, such as cigarettes and e-cigarettes. If you need help quitting, ask your health care provider.  Do not use any medicinal herbs or unprescribed drugs. These chemicals affect the formation and growth of the baby.  Do not douche or use tampons or scented sanitary pads.  Do not cross your legs for long periods of time.  To prepare for the arrival of your baby: ? Take prenatal classes to understand, practice, and ask questions about labor and delivery. ? Make a trial run to the hospital. ? Visit the hospital and tour the maternity area. ? Arrange for maternity or paternity leave through employers. ? Arrange for family and friends to take care of pets while you are in the hospital. ? Purchase a rear-facing car seat and make sure you know how to install it in your car. ? Pack your hospital bag. ? Prepare the baby's nursery. Make sure to remove all pillows and stuffed animals from the baby's crib to prevent suffocation.  Visit your dentist if you have not gone during your pregnancy. Use a soft toothbrush to brush your teeth and be gentle when you floss. Contact a health care provider if:  You are unsure if you are in labor or if your water has broken.  You become dizzy.  You have mild pelvic cramps, pelvic pressure, or nagging pain in your abdominal area.  You have lower back pain.  You have persistent nausea, vomiting, or  diarrhea.  You have an unusual or bad smelling vaginal discharge.  You have pain when you urinate. Get help right away if:  Your water breaks before 37 weeks.  You have regular contractions less than 5 minutes apart before 37 weeks.  You have a fever.  You are leaking fluid from your vagina.  You have spotting or bleeding from your vagina.  You have severe abdominal pain or cramping.  You have rapid weight loss or weight gain.  You have   shortness of breath with chest pain.  You notice sudden or extreme swelling of your face, hands, ankles, feet, or legs.  Your baby makes fewer than 10 movements in 2 hours.  You have severe headaches that do not go away when you take medicine.  You have vision changes. Summary  The third trimester is from week 28 through week 40, months 7 through 9. The third trimester is a time when the unborn baby (fetus) is growing rapidly.  During the third trimester, your discomfort may increase as you and your baby continue to gain weight. You may have abdominal, leg, and back pain, sleeping problems, and an increased need to urinate.  During the third trimester your breasts will keep growing and they will continue to become tender. A yellow fluid (colostrum) may leak from your breasts. This is the first milk you are producing for your baby.  False labor is a condition in which you feel small, irregular tightenings of the muscles in the womb (contractions) that eventually go away. These are called Braxton Hicks contractions. Contractions may last for hours, days, or even weeks before true labor sets in.  Signs of labor can include: abdominal cramps; regular contractions that start at 10 minutes apart and become stronger and more frequent with time; watery or bloody mucus discharge that comes from the vagina; increased pelvic pressure and dull back pain; and leaking of amniotic fluid. This information is not intended to replace advice given to you by your  health care provider. Make sure you discuss any questions you have with your health care provider. Document Revised: 11/16/2018 Document Reviewed: 08/31/2016 Elsevier Patient Education  2020 [employer]  

## 2019-09-10 ENCOUNTER — Ambulatory Visit (HOSPITAL_COMMUNITY)
Admission: RE | Admit: 2019-09-10 | Discharge: 2019-09-10 | Disposition: A | Discharge status: Home / Self Care | Payer: Medicaid Other | Source: Ambulatory Visit | Attending: Obstetrics and Gynecology | Admitting: Obstetrics and Gynecology

## 2019-09-10 ENCOUNTER — Other Ambulatory Visit: Payer: Self-pay

## 2019-09-10 ENCOUNTER — Other Ambulatory Visit (HOSPITAL_COMMUNITY): Payer: Self-pay | Admitting: *Deleted

## 2019-09-10 ENCOUNTER — Encounter (HOSPITAL_COMMUNITY): Payer: Self-pay | Admitting: *Deleted

## 2019-09-10 ENCOUNTER — Ambulatory Visit (HOSPITAL_COMMUNITY): Payer: Medicaid Other | Admitting: *Deleted

## 2019-09-10 DIAGNOSIS — Z3A27 27 weeks gestation of pregnancy: Secondary | ICD-10-CM

## 2019-09-10 DIAGNOSIS — Z6841 Body Mass Index (BMI) 40.0 and over, adult: Secondary | ICD-10-CM

## 2019-09-10 DIAGNOSIS — O99212 Obesity complicating pregnancy, second trimester: Secondary | ICD-10-CM

## 2019-09-10 DIAGNOSIS — O10919 Unspecified pre-existing hypertension complicating pregnancy, unspecified trimester: Secondary | ICD-10-CM | POA: Diagnosis not present

## 2019-09-10 DIAGNOSIS — O09522 Supervision of elderly multigravida, second trimester: Secondary | ICD-10-CM

## 2019-09-10 DIAGNOSIS — O10012 Pre-existing essential hypertension complicating pregnancy, second trimester: Secondary | ICD-10-CM

## 2019-09-10 DIAGNOSIS — O09299 Supervision of pregnancy with other poor reproductive or obstetric history, unspecified trimester: Secondary | ICD-10-CM | POA: Diagnosis present

## 2019-09-10 DIAGNOSIS — Z3480 Encounter for supervision of other normal pregnancy, unspecified trimester: Secondary | ICD-10-CM | POA: Diagnosis present

## 2019-09-10 DIAGNOSIS — D259 Leiomyoma of uterus, unspecified: Secondary | ICD-10-CM

## 2019-09-10 DIAGNOSIS — O3412 Maternal care for benign tumor of corpus uteri, second trimester: Secondary | ICD-10-CM

## 2019-09-10 DIAGNOSIS — O24112 Pre-existing diabetes mellitus, type 2, in pregnancy, second trimester: Secondary | ICD-10-CM | POA: Diagnosis not present

## 2019-09-10 DIAGNOSIS — Z362 Encounter for other antenatal screening follow-up: Secondary | ICD-10-CM | POA: Diagnosis not present

## 2019-09-21 ENCOUNTER — Other Ambulatory Visit: Payer: Self-pay

## 2019-09-21 ENCOUNTER — Ambulatory Visit (INDEPENDENT_AMBULATORY_CARE_PROVIDER_SITE_OTHER): Payer: Medicaid Other | Admitting: Family Medicine

## 2019-09-21 VITALS — BP 124/51 | HR 105 | Wt 263.0 lb

## 2019-09-21 DIAGNOSIS — O0990 Supervision of high risk pregnancy, unspecified, unspecified trimester: Secondary | ICD-10-CM

## 2019-09-21 DIAGNOSIS — O09299 Supervision of pregnancy with other poor reproductive or obstetric history, unspecified trimester: Secondary | ICD-10-CM

## 2019-09-21 DIAGNOSIS — O365930 Maternal care for other known or suspected poor fetal growth, third trimester, not applicable or unspecified: Secondary | ICD-10-CM

## 2019-09-21 DIAGNOSIS — O10013 Pre-existing essential hypertension complicating pregnancy, third trimester: Secondary | ICD-10-CM

## 2019-09-21 DIAGNOSIS — O0993 Supervision of high risk pregnancy, unspecified, third trimester: Secondary | ICD-10-CM

## 2019-09-21 DIAGNOSIS — Z3A28 28 weeks gestation of pregnancy: Secondary | ICD-10-CM

## 2019-09-21 DIAGNOSIS — Z98891 History of uterine scar from previous surgery: Secondary | ICD-10-CM

## 2019-09-21 DIAGNOSIS — E119 Type 2 diabetes mellitus without complications: Secondary | ICD-10-CM

## 2019-09-21 DIAGNOSIS — O24119 Pre-existing diabetes mellitus, type 2, in pregnancy, unspecified trimester: Secondary | ICD-10-CM

## 2019-09-21 DIAGNOSIS — Z23 Encounter for immunization: Secondary | ICD-10-CM

## 2019-09-21 DIAGNOSIS — O24113 Pre-existing diabetes mellitus, type 2, in pregnancy, third trimester: Secondary | ICD-10-CM

## 2019-09-21 DIAGNOSIS — O09293 Supervision of pregnancy with other poor reproductive or obstetric history, third trimester: Secondary | ICD-10-CM

## 2019-09-21 DIAGNOSIS — O10919 Unspecified pre-existing hypertension complicating pregnancy, unspecified trimester: Secondary | ICD-10-CM

## 2019-09-21 DIAGNOSIS — O09529 Supervision of elderly multigravida, unspecified trimester: Secondary | ICD-10-CM

## 2019-09-21 DIAGNOSIS — O09523 Supervision of elderly multigravida, third trimester: Secondary | ICD-10-CM

## 2019-09-21 DIAGNOSIS — Z6841 Body Mass Index (BMI) 40.0 and over, adult: Secondary | ICD-10-CM

## 2019-09-21 DIAGNOSIS — Z3480 Encounter for supervision of other normal pregnancy, unspecified trimester: Secondary | ICD-10-CM

## 2019-09-21 DIAGNOSIS — O99213 Obesity complicating pregnancy, third trimester: Secondary | ICD-10-CM

## 2019-09-21 MED ORDER — INSULIN DETEMIR 100 UNIT/ML ~~LOC~~ SOLN: SUBCUTANEOUS | 11 refills | Status: DC

## 2019-09-21 MED ORDER — INSULIN LISPRO 100 UNIT/ML ~~LOC~~ SOLN: 15.0000 [IU] | Freq: Three times a day (TID) | SUBCUTANEOUS | 11 refills | Status: DC

## 2019-09-21 NOTE — Progress Notes (Signed)
   PRENATAL VISIT NOTE  Subjective:  Sandra Mason is a 39 y.o. G2P1001 at [redacted]w[redacted]d being seen today for ongoing prenatal care.  She is currently monitored for the following issues for this high-risk pregnancy and has Supervision of other normal pregnancy, antepartum; Hx of pre-eclampsia in prior pregnancy, currently pregnant, unspecified trimester; History of gestational diabetes mellitus (GDM); History of C-section; Class 3 severe obesity due to excess calories without serious comorbidity with body mass index (BMI) of 45.0 to 49.9 in adult [facility] Chronic hypertension affecting pregnancy; AMA (advanced maternal age) multigravida 33+; and Type 2 diabetes mellitus affecting pregnancy, antepartum on their problem list.  Patient reports no complaints.  Contractions: Not present. Vag. Bleeding: None.  Movement: Present. Denies leaking of fluid.   The following portions of the patient's history were reviewed and updated as appropriate: allergies, current medications, past family history, past medical history, past social history, past surgical history and problem list.   Objective:   Vitals:   09/21/19 1021  BP: (!) 124/51  Pulse: (!) 105  Weight: 263 lb 0.6 oz (119.3 kg)    Fetal Status: Fetal Heart Rate (bpm): 160   Movement: Present     General:  Alert, oriented and cooperative. Patient is in no acute distress.  Skin: Skin is warm and dry. No rash noted.   Cardiovascular: Normal heart rate noted  Respiratory: Normal respiratory effort, no problems with respiration noted  Abdomen: Soft, gravid, appropriate for gestational age.  Pain/Pressure: Absent     Pelvic: Cervical exam deferred        Extremities: Normal range of motion.  Edema: None  Mental Status: Normal mood and affect. Normal behavior. Normal judgment and thought content.   Assessment and Plan:  Pregnancy: G2P1001 at [redacted]w[redacted]d 1. Supervision of high risk pregnancy, antepartum FHT normal - CBC - HIV Antibody (routine testing w  rflx) - RPR - Tdap vaccine greater than or equal to 7yo IM  2. Type 2 diabetes mellitus affecting pregnancy, antepartum Fetal echo normal fastings 95-97 2hr PP controlled Serial growth [place] - EFW 7-8%  3. Chronic hypertension affecting pregnancy BP normal Continue ASA 81mg   4. Antepartum multigravida of advanced maternal age  28. Hx of pre-eclampsia in prior pregnancy, currently pregnant, unspecified trimester On ASA 81mg   6. Class 3 severe obesity due to excess calories without serious comorbidity with body mass index (BMI) of 45.0 to 49.9 in adult ([place])  7. History of C-section Discussed mode of delivery - she desires VBAC if possible. However, baby is FGR. If long induction, baby may not tolerate. Additionally, may depend on cervical exam.   Preterm labor symptoms and general obstetric precautions including but not limited to vaginal bleeding, contractions, leaking of fluid and fetal movement were reviewed in detail with the patient. Please refer to After Visit Summary for other counseling recommendations.   No follow-ups on file.  Future Appointments  Date Time Provider [place]  09/25/2019 11:15 AM [place] [facility]  09/25/2019 11:15 AM [place] 4 [facility] M[facility]    Truett Mainland, DO

## 2019-09-22 LAB — CBC
Hematocrit: 34 % (ref 34.0–46.6)
Hemoglobin: 11.8 g/dL (ref 11.1–15.9)
MCH: 30.8 pg (ref 26.6–33.0)
MCHC: 34.7 g/dL (ref 31.5–35.7)
MCV: 89 fL (ref 79–97)
Platelets: 387 10*3/uL (ref 150–450)
RBC: 3.83 x10E6/uL (ref 3.77–5.28)
RDW: 14.2 % (ref 11.7–15.4)
WBC: 11 10*3/uL — ABNORMAL HIGH (ref 3.4–10.8)

## 2019-09-22 LAB — RPR: RPR Ser Ql: NONREACTIVE

## 2019-09-22 LAB — HIV ANTIBODY (ROUTINE TESTING W REFLEX): HIV Screen 4th Generation wRfx: NONREACTIVE

## 2019-09-25 ENCOUNTER — Other Ambulatory Visit: Payer: Self-pay

## 2019-09-25 ENCOUNTER — Other Ambulatory Visit (HOSPITAL_COMMUNITY): Payer: Self-pay | Admitting: *Deleted

## 2019-09-25 ENCOUNTER — Ambulatory Visit (HOSPITAL_COMMUNITY): Payer: Medicaid Other | Admitting: *Deleted

## 2019-09-25 ENCOUNTER — Ambulatory Visit (HOSPITAL_COMMUNITY)
Admission: RE | Admit: 2019-09-25 | Discharge: 2019-09-25 | Disposition: A | Discharge status: Home / Self Care | Payer: Medicaid Other | Source: Ambulatory Visit | Attending: Obstetrics and Gynecology | Admitting: Obstetrics and Gynecology

## 2019-09-25 ENCOUNTER — Encounter (HOSPITAL_COMMUNITY): Payer: Self-pay

## 2019-09-25 DIAGNOSIS — O09299 Supervision of pregnancy with other poor reproductive or obstetric history, unspecified trimester: Secondary | ICD-10-CM | POA: Diagnosis present

## 2019-09-25 DIAGNOSIS — O10919 Unspecified pre-existing hypertension complicating pregnancy, unspecified trimester: Secondary | ICD-10-CM | POA: Diagnosis present

## 2019-09-25 DIAGNOSIS — Z3480 Encounter for supervision of other normal pregnancy, unspecified trimester: Secondary | ICD-10-CM | POA: Insufficient documentation

## 2019-09-25 DIAGNOSIS — O24313 Unspecified pre-existing diabetes mellitus in pregnancy, third trimester: Secondary | ICD-10-CM

## 2019-09-25 DIAGNOSIS — Z3A29 29 weeks gestation of pregnancy: Secondary | ICD-10-CM

## 2019-09-25 DIAGNOSIS — Z362 Encounter for other antenatal screening follow-up: Secondary | ICD-10-CM | POA: Diagnosis not present

## 2019-09-25 DIAGNOSIS — O24113 Pre-existing diabetes mellitus, type 2, in pregnancy, third trimester: Secondary | ICD-10-CM

## 2019-10-05 ENCOUNTER — Other Ambulatory Visit: Payer: Self-pay

## 2019-10-05 ENCOUNTER — Ambulatory Visit (INDEPENDENT_AMBULATORY_CARE_PROVIDER_SITE_OTHER): Payer: Medicaid Other | Admitting: Family Medicine

## 2019-10-05 VITALS — BP 116/82 | HR 115 | Wt 266.0 lb

## 2019-10-05 DIAGNOSIS — Z98891 History of uterine scar from previous surgery: Secondary | ICD-10-CM

## 2019-10-05 DIAGNOSIS — Z3A30 30 weeks gestation of pregnancy: Secondary | ICD-10-CM

## 2019-10-05 DIAGNOSIS — Z3480 Encounter for supervision of other normal pregnancy, unspecified trimester: Secondary | ICD-10-CM

## 2019-10-05 DIAGNOSIS — O10919 Unspecified pre-existing hypertension complicating pregnancy, unspecified trimester: Secondary | ICD-10-CM

## 2019-10-05 DIAGNOSIS — O10913 Unspecified pre-existing hypertension complicating pregnancy, third trimester: Secondary | ICD-10-CM

## 2019-10-05 DIAGNOSIS — O24113 Pre-existing diabetes mellitus, type 2, in pregnancy, third trimester: Secondary | ICD-10-CM

## 2019-10-05 DIAGNOSIS — O09529 Supervision of elderly multigravida, unspecified trimester: Secondary | ICD-10-CM

## 2019-10-05 DIAGNOSIS — O09293 Supervision of pregnancy with other poor reproductive or obstetric history, third trimester: Secondary | ICD-10-CM

## 2019-10-05 DIAGNOSIS — O24119 Pre-existing diabetes mellitus, type 2, in pregnancy, unspecified trimester: Secondary | ICD-10-CM

## 2019-10-05 DIAGNOSIS — O34219 Maternal care for unspecified type scar from previous cesarean delivery: Secondary | ICD-10-CM

## 2019-10-05 DIAGNOSIS — O09523 Supervision of elderly multigravida, third trimester: Secondary | ICD-10-CM

## 2019-10-05 DIAGNOSIS — O09299 Supervision of pregnancy with other poor reproductive or obstetric history, unspecified trimester: Secondary | ICD-10-CM

## 2019-10-05 NOTE — Progress Notes (Signed)
Subjective:  Sandra Mason is a 39 y.o. G2P1001 at [redacted]w[redacted]d being seen today for ongoing prenatal care.  She is currently monitored for the following issues for this high-risk pregnancy and has Supervision of other normal pregnancy, antepartum; Hx of pre-eclampsia in prior pregnancy, currently pregnant, unspecified trimester; History of C-section; Class 3 severe obesity due to excess calories without serious comorbidity with body mass index (BMI) of 45.0 to 49.9 in adult [facility]); Chronic hypertension affecting pregnancy; AMA (advanced maternal age) multigravida 72+; and Type 2 diabetes mellitus affecting pregnancy, antepartum on their problem list.  GDM: Patient taking Levemir and lispro.  Reports no hypoglycemic episodes.  Tolerating medication well Fasting: 94-95 2hr PP: all <120  Patient reports no complaints.   . Vag. Bleeding: None.  Movement: Present. Denies leaking of fluid.   The following portions of the patient's history were reviewed and updated as appropriate: allergies, current medications, past family history, past medical history, past social history, past surgical history and problem list. Problem list updated.  Objective:   Vitals:   10/05/19 1021  BP: 116/82  Pulse: (!) 115  Weight: 266 lb (120.7 kg)    Fetal Status: Fetal Heart Rate (bpm): 150   Movement: Present     General:  Alert, oriented and cooperative. Patient is in no acute distress.  Skin: Skin is warm and dry. No rash noted.   Cardiovascular: Normal heart rate noted  Respiratory: Normal respiratory effort, no problems with respiration noted  Abdomen: Soft, gravid, appropriate for gestational age. Pain/Pressure: Absent     Pelvic: Vag. Bleeding: None     Cervical exam deferred        Extremities: Normal range of motion.  Edema: None  Mental Status: Normal mood and affect. Normal behavior. Normal judgment and thought content.   Urinalysis:      Assessment and Plan:  Pregnancy: G2P1001 at [redacted]w[redacted]d  1.  Supervision of other normal pregnancy, antepartum FHT and FH normal.   2. Hx of pre-eclampsia in prior pregnancy, currently pregnant, unspecified trimester Taking ASA 81mg   3. History of C-section Scheduled c/s for severe preeclampsia. Did not labor. Would like to VBAC.  4. Antepartum multigravida of advanced maternal age  25. Chronic hypertension affecting pregnancy BP normal range On labetalol  6. Class 3 severe obesity due to excess calories without serious comorbidity with body mass index (BMI) of 45.0 to 49.9 in adult ([place])  7. Type 2 diabetes mellitus affecting pregnancy, antepartum Continue insulin. Reported values in range Continue growth [place] Last growth 47%, AC 74%  Preterm labor symptoms and general obstetric precautions including but not limited to vaginal bleeding, contractions, leaking of fluid and fetal movement were reviewed in detail with the patient. Please refer to After Visit Summary for other counseling recommendations.  No follow-ups on file.   Truett Mainland, DO

## 2019-10-16 ENCOUNTER — Other Ambulatory Visit: Payer: Self-pay

## 2019-10-16 ENCOUNTER — Ambulatory Visit (HOSPITAL_COMMUNITY)
Admission: RE | Admit: 2019-10-16 | Discharge: 2019-10-16 | Disposition: A | Discharge status: Home / Self Care | Payer: Medicaid Other | Source: Ambulatory Visit | Attending: Obstetrics and Gynecology | Admitting: Obstetrics and Gynecology

## 2019-10-16 ENCOUNTER — Ambulatory Visit (HOSPITAL_COMMUNITY): Payer: Medicaid Other | Admitting: *Deleted

## 2019-10-16 ENCOUNTER — Encounter (HOSPITAL_COMMUNITY): Payer: Self-pay

## 2019-10-16 DIAGNOSIS — Z3480 Encounter for supervision of other normal pregnancy, unspecified trimester: Secondary | ICD-10-CM

## 2019-10-16 DIAGNOSIS — O10919 Unspecified pre-existing hypertension complicating pregnancy, unspecified trimester: Secondary | ICD-10-CM

## 2019-10-16 DIAGNOSIS — Z794 Long term (current) use of insulin: Secondary | ICD-10-CM | POA: Diagnosis not present

## 2019-10-16 DIAGNOSIS — O09299 Supervision of pregnancy with other poor reproductive or obstetric history, unspecified trimester: Secondary | ICD-10-CM

## 2019-10-16 DIAGNOSIS — O24113 Pre-existing diabetes mellitus, type 2, in pregnancy, third trimester: Secondary | ICD-10-CM | POA: Diagnosis not present

## 2019-10-16 DIAGNOSIS — O10913 Unspecified pre-existing hypertension complicating pregnancy, third trimester: Secondary | ICD-10-CM | POA: Insufficient documentation

## 2019-10-16 DIAGNOSIS — O99213 Obesity complicating pregnancy, third trimester: Secondary | ICD-10-CM | POA: Insufficient documentation

## 2019-10-16 DIAGNOSIS — O09523 Supervision of elderly multigravida, third trimester: Secondary | ICD-10-CM | POA: Insufficient documentation

## 2019-10-16 DIAGNOSIS — Z6841 Body Mass Index (BMI) 40.0 and over, adult: Secondary | ICD-10-CM

## 2019-10-16 DIAGNOSIS — Z362 Encounter for other antenatal screening follow-up: Secondary | ICD-10-CM

## 2019-10-16 DIAGNOSIS — Z3A32 32 weeks gestation of pregnancy: Secondary | ICD-10-CM | POA: Diagnosis not present

## 2019-10-18 ENCOUNTER — Ambulatory Visit (INDEPENDENT_AMBULATORY_CARE_PROVIDER_SITE_OTHER): Payer: Medicaid Other | Admitting: Obstetrics & Gynecology

## 2019-10-18 ENCOUNTER — Other Ambulatory Visit: Payer: Self-pay

## 2019-10-18 VITALS — BP 146/60 | HR 110 | Wt 265.0 lb

## 2019-10-18 DIAGNOSIS — Z6841 Body Mass Index (BMI) 40.0 and over, adult: Secondary | ICD-10-CM

## 2019-10-18 DIAGNOSIS — Z3480 Encounter for supervision of other normal pregnancy, unspecified trimester: Secondary | ICD-10-CM

## 2019-10-18 DIAGNOSIS — O09529 Supervision of elderly multigravida, unspecified trimester: Secondary | ICD-10-CM

## 2019-10-18 DIAGNOSIS — O10913 Unspecified pre-existing hypertension complicating pregnancy, third trimester: Secondary | ICD-10-CM

## 2019-10-18 DIAGNOSIS — Z98891 History of uterine scar from previous surgery: Secondary | ICD-10-CM

## 2019-10-18 DIAGNOSIS — Z3A32 32 weeks gestation of pregnancy: Secondary | ICD-10-CM

## 2019-10-18 DIAGNOSIS — O09299 Supervision of pregnancy with other poor reproductive or obstetric history, unspecified trimester: Secondary | ICD-10-CM

## 2019-10-18 DIAGNOSIS — O24119 Pre-existing diabetes mellitus, type 2, in pregnancy, unspecified trimester: Secondary | ICD-10-CM

## 2019-10-18 DIAGNOSIS — O10919 Unspecified pre-existing hypertension complicating pregnancy, unspecified trimester: Secondary | ICD-10-CM

## 2019-10-18 NOTE — Progress Notes (Signed)
   PRENATAL VISIT NOTE  Subjective:  Sandra Mason is a 39 y.o. G2P1001 at [redacted]w[redacted]d being seen today for ongoing prenatal care.  She is currently monitored for the following issues for this high-risk pregnancy and has Supervision of other normal pregnancy, antepartum; Hx of pre-eclampsia in prior pregnancy, currently pregnant, unspecified trimester; History of C-section; Class 3 severe obesity due to excess calories without serious comorbidity with body mass index (BMI) of 45.0 to 49.9 in adult [facility]); Chronic hypertension affecting pregnancy; AMA (advanced maternal age) multigravida 70+; and Type 2 diabetes mellitus affecting pregnancy, antepartum on their problem list.  Patient reports no complaints.  Contractions: Not present. Vag. Bleeding: None.  Movement: Present. Denies leaking of fluid.   The following portions of the patient's history were reviewed and updated as appropriate: allergies, current medications, past family history, past medical history, past social history, past surgical history and problem list.   Objective:   Vitals:   10/18/19 1034  BP: (!) 150/68  Pulse: (!) 113  Weight: 265 lb (120.2 kg)    Fetal Status:     Movement: Present     General:  Alert, oriented and cooperative. Patient is in no acute distress.  Skin: Skin is warm and dry. No rash noted.   Cardiovascular: Normal heart rate noted  Respiratory: Normal respiratory effort, no problems with respiration noted  Abdomen: Soft, gravid, appropriate for gestational age.  Pain/Pressure: Absent     Pelvic: Cervical exam deferred        Extremities: Normal range of motion.  Edema: None  Mental Status: Normal mood and affect. Normal behavior. Normal judgment and thought content.   Assessment and Plan:  Pregnancy: G2P1001 at [redacted]w[redacted]d 1. Supervision of other normal pregnancy, antepartum FHT and FH normal.   2. Hx of pre-eclampsia in prior pregnancy, currently pregnant, unspecified trimester Taking ASA 81mg   3.  History of C-section Scheduled c/s for severe preeclampsia. Did not labor. Would like to VBAC. Baby is transverse at present. If c/s patient desires BTL. Title XIX papers signed today     4. Antepartum multigravida of advanced maternal age  5. Chronic hypertension affecting pregnancy BP normal range On labetalol Weekly BPPs at MFM  6. Class 3 severe obesity due to excess calories without serious comorbidity with body mass index (BMI) of 45.0 to 49.9 in adult ([place])  7. Type 2 diabetes mellitus affecting pregnancy, antepartum Continue insulin. Reported values in range Continue growth [place]  Preterm labor symptoms and general obstetric precautions including but not limited to vaginal bleeding, contractions, leaking of fluid and fetal movement were reviewed in detail with the patient. Please refer to After Visit Summary for other counseling recommendations.   Return in about 2 weeks (around 11/01/2019) for in person.  Future Appointments  Date Time Provider [place]  10/23/2019 10:30 AM [place] [facility]  10/23/2019 10:30 AM [facility] [place] 1 WH-MFCUS MFC-US  10/30/2019  9:15 AM [facility]-MFC NURSE [facility] M[facility]-US  10/30/2019  9:15 AM [facility] [place] 4 [facility]-MFCUS M[facility]-US    Lavonia Drafts, MD

## 2019-10-23 ENCOUNTER — Ambulatory Visit (HOSPITAL_COMMUNITY)
Admission: RE | Admit: 2019-10-23 | Discharge: 2019-10-23 | Disposition: A | Discharge status: Home / Self Care | Payer: Medicaid Other | Source: Ambulatory Visit | Attending: Obstetrics and Gynecology | Admitting: Obstetrics and Gynecology

## 2019-10-23 ENCOUNTER — Encounter (HOSPITAL_COMMUNITY): Payer: Self-pay

## 2019-10-23 ENCOUNTER — Ambulatory Visit (HOSPITAL_COMMUNITY): Payer: Medicaid Other | Admitting: *Deleted

## 2019-10-23 ENCOUNTER — Other Ambulatory Visit (HOSPITAL_COMMUNITY): Payer: Self-pay | Admitting: Obstetrics

## 2019-10-23 ENCOUNTER — Other Ambulatory Visit: Payer: Self-pay

## 2019-10-23 VITALS — BP 115/91 | HR 98 | Temp 97.6°F

## 2019-10-23 DIAGNOSIS — O10919 Unspecified pre-existing hypertension complicating pregnancy, unspecified trimester: Secondary | ICD-10-CM

## 2019-10-23 DIAGNOSIS — O09299 Supervision of pregnancy with other poor reproductive or obstetric history, unspecified trimester: Secondary | ICD-10-CM | POA: Insufficient documentation

## 2019-10-23 DIAGNOSIS — O09523 Supervision of elderly multigravida, third trimester: Secondary | ICD-10-CM

## 2019-10-23 DIAGNOSIS — O24113 Pre-existing diabetes mellitus, type 2, in pregnancy, third trimester: Secondary | ICD-10-CM | POA: Diagnosis present

## 2019-10-23 DIAGNOSIS — O99210 Obesity complicating pregnancy, unspecified trimester: Secondary | ICD-10-CM

## 2019-10-23 DIAGNOSIS — Z3480 Encounter for supervision of other normal pregnancy, unspecified trimester: Secondary | ICD-10-CM | POA: Diagnosis present

## 2019-10-23 DIAGNOSIS — O24319 Unspecified pre-existing diabetes mellitus in pregnancy, unspecified trimester: Secondary | ICD-10-CM | POA: Diagnosis present

## 2019-10-23 DIAGNOSIS — Z794 Long term (current) use of insulin: Secondary | ICD-10-CM | POA: Diagnosis present

## 2019-10-23 DIAGNOSIS — O10019 Pre-existing essential hypertension complicating pregnancy, unspecified trimester: Secondary | ICD-10-CM

## 2019-10-23 DIAGNOSIS — Z3A33 33 weeks gestation of pregnancy: Secondary | ICD-10-CM

## 2019-10-23 NOTE — Procedures (Signed)
Sandra Mason [birth year] [redacted]w[redacted]d  Fetus A Non-Stress Test Interpretation for 10/23/19  Indication: Diabetes  Fetal Heart Rate A Mode: External Baseline Rate (A): 150 bpm Variability: Moderate Accelerations: 15 x 15 Decelerations: None Multiple birth?: No  Uterine Activity Mode: Palpation, Toco Contraction Frequency (min): None Resting Tone Palpated: Relaxed Resting Time: Adequate  Interpretation (Fetal Testing) Nonstress Test Interpretation: Reactive Comments: Tracing reviewed by Dr. Donalee Citrin

## 2019-10-29 ENCOUNTER — Ambulatory Visit (HOSPITAL_COMMUNITY): Payer: Medicaid Other

## 2019-10-30 ENCOUNTER — Ambulatory Visit (HOSPITAL_COMMUNITY)
Admission: RE | Admit: 2019-10-30 | Discharge: 2019-10-30 | Disposition: A | Discharge status: Home / Self Care | Payer: Medicaid Other | Source: Ambulatory Visit | Attending: Obstetrics and Gynecology | Admitting: Obstetrics and Gynecology

## 2019-10-30 ENCOUNTER — Ambulatory Visit (HOSPITAL_COMMUNITY): Payer: Medicaid Other | Admitting: *Deleted

## 2019-10-30 ENCOUNTER — Other Ambulatory Visit (HOSPITAL_COMMUNITY): Payer: Self-pay | Admitting: *Deleted

## 2019-10-30 ENCOUNTER — Other Ambulatory Visit: Payer: Self-pay

## 2019-10-30 ENCOUNTER — Encounter (HOSPITAL_COMMUNITY): Payer: Self-pay

## 2019-10-30 DIAGNOSIS — Z3480 Encounter for supervision of other normal pregnancy, unspecified trimester: Secondary | ICD-10-CM | POA: Insufficient documentation

## 2019-10-30 DIAGNOSIS — O24113 Pre-existing diabetes mellitus, type 2, in pregnancy, third trimester: Secondary | ICD-10-CM

## 2019-10-30 DIAGNOSIS — O99213 Obesity complicating pregnancy, third trimester: Secondary | ICD-10-CM

## 2019-10-30 DIAGNOSIS — O322XX0 Maternal care for transverse and oblique lie, not applicable or unspecified: Secondary | ICD-10-CM

## 2019-10-30 DIAGNOSIS — O09523 Supervision of elderly multigravida, third trimester: Secondary | ICD-10-CM

## 2019-10-30 DIAGNOSIS — O24319 Unspecified pre-existing diabetes mellitus in pregnancy, unspecified trimester: Secondary | ICD-10-CM

## 2019-10-30 DIAGNOSIS — O10919 Unspecified pre-existing hypertension complicating pregnancy, unspecified trimester: Secondary | ICD-10-CM | POA: Insufficient documentation

## 2019-10-30 DIAGNOSIS — Z3A34 34 weeks gestation of pregnancy: Secondary | ICD-10-CM

## 2019-10-30 DIAGNOSIS — O09299 Supervision of pregnancy with other poor reproductive or obstetric history, unspecified trimester: Secondary | ICD-10-CM | POA: Insufficient documentation

## 2019-10-30 DIAGNOSIS — O10013 Pre-existing essential hypertension complicating pregnancy, third trimester: Secondary | ICD-10-CM | POA: Diagnosis not present

## 2019-11-01 ENCOUNTER — Other Ambulatory Visit: Payer: Self-pay

## 2019-11-01 ENCOUNTER — Ambulatory Visit (INDEPENDENT_AMBULATORY_CARE_PROVIDER_SITE_OTHER): Payer: Medicaid Other | Admitting: Family Medicine

## 2019-11-01 VITALS — BP 127/59 | HR 102 | Wt 268.0 lb

## 2019-11-01 DIAGNOSIS — Z3480 Encounter for supervision of other normal pregnancy, unspecified trimester: Secondary | ICD-10-CM

## 2019-11-01 DIAGNOSIS — O10913 Unspecified pre-existing hypertension complicating pregnancy, third trimester: Secondary | ICD-10-CM

## 2019-11-01 DIAGNOSIS — Z3A34 34 weeks gestation of pregnancy: Secondary | ICD-10-CM

## 2019-11-01 DIAGNOSIS — Z6841 Body Mass Index (BMI) 40.0 and over, adult: Secondary | ICD-10-CM

## 2019-11-01 DIAGNOSIS — O24113 Pre-existing diabetes mellitus, type 2, in pregnancy, third trimester: Secondary | ICD-10-CM

## 2019-11-01 DIAGNOSIS — Z98891 History of uterine scar from previous surgery: Secondary | ICD-10-CM

## 2019-11-01 DIAGNOSIS — O24119 Pre-existing diabetes mellitus, type 2, in pregnancy, unspecified trimester: Secondary | ICD-10-CM

## 2019-11-01 DIAGNOSIS — O34219 Maternal care for unspecified type scar from previous cesarean delivery: Secondary | ICD-10-CM

## 2019-11-01 DIAGNOSIS — O09293 Supervision of pregnancy with other poor reproductive or obstetric history, third trimester: Secondary | ICD-10-CM

## 2019-11-01 DIAGNOSIS — O09299 Supervision of pregnancy with other poor reproductive or obstetric history, unspecified trimester: Secondary | ICD-10-CM

## 2019-11-01 DIAGNOSIS — O10919 Unspecified pre-existing hypertension complicating pregnancy, unspecified trimester: Secondary | ICD-10-CM

## 2019-11-01 DIAGNOSIS — O09523 Supervision of elderly multigravida, third trimester: Secondary | ICD-10-CM

## 2019-11-01 NOTE — Progress Notes (Signed)
   PRENATAL VISIT NOTE  Subjective:  Sandra Mason is a 39 y.o. G2P1001 at [redacted]w[redacted]d being seen today for ongoing prenatal care.  She is currently monitored for the following issues for this high-risk pregnancy and has Supervision of other normal pregnancy, antepartum; Hx of pre-eclampsia in prior pregnancy, currently pregnant, unspecified trimester; History of C-section; Class 3 severe obesity due to excess calories without serious comorbidity with body mass index (BMI) of 45.0 to 49.9 in adult [facility]); Chronic hypertension affecting pregnancy; AMA (advanced maternal age) multigravida 79+; and Type 2 diabetes mellitus affecting pregnancy, antepartum on their problem list.  Patient reports no complaints.  Contractions: Not present.  .  Movement: Present. Denies leaking of fluid.   The following portions of the patient's history were reviewed and updated as appropriate: allergies, current medications, past family history, past medical history, past social history, past surgical history and problem list.   Objective:   Vitals:   11/01/19 1016  BP: (!) 127/59  Pulse: (!) 102  Weight: 268 lb (121.6 kg)    Fetal Status: Fetal Heart Rate (bpm): 155   Movement: Present     General:  Alert, oriented and cooperative. Patient is in no acute distress.  Skin: Skin is warm and dry. No rash noted.   Cardiovascular: Normal heart rate noted  Respiratory: Normal respiratory effort, no problems with respiration noted  Abdomen: Soft, gravid, appropriate for gestational age.  Pain/Pressure: Absent     Pelvic: Cervical exam deferred        Extremities: Normal range of motion.  Edema: None  Mental Status: Normal mood and affect. Normal behavior. Normal judgment and thought content.   Assessment and Plan:  Pregnancy: G2P1001 at [redacted]w[redacted]d 1. Supervision of other normal pregnancy, antepartum FHT normal. Growth [place] on 3/9 shows 33%.  2. History of C-section Desires VBAC. Patient has been counseled, but I do not see  a consent form on record, which I noticed after pt left. Will sign next visit. She desires BTL if has a c/s.  3. Class 3 severe obesity due to excess calories without serious comorbidity with body mass index (BMI) of 45.0 to 49.9 in adult ([place])  4. Type 2 diabetes mellitus affecting pregnancy, antepartum Reports control of both fasting and postprandial. On Levemir 25u in AM, 42u @ night Lispro 20 in AM, 15 with lunch and dinner   5. Chronic hypertension affecting pregnancy BP controlled On ASA 81mg   6. Hx of pre-eclampsia in prior pregnancy, currently pregnant, unspecified trimester On ASA  7. Multigravida of advanced maternal age in third trimester   Preterm labor symptoms and general obstetric precautions including but not limited to vaginal bleeding, contractions, leaking of fluid and fetal movement were reviewed in detail with the patient. Please refer to After Visit Summary for other counseling recommendations.   Return for OB f/u, GBS.  Future Appointments  Date Time Provider [place]  11/06/2019  3:15 PM [place] [facility]-US  11/06/2019  3:15 PM [place] 4 WH-MFCUS MFC-US  11/13/2019  4:00 PM [place] MFC-US  11/13/2019  4:00 PM [place] 3 WH-MFCUS MFC-US  11/15/2019  1:45 PM Truett Mainland, DO [facility] None  11/20/2019  7:45 AM WH-MFC NURSE WH-MFC MFC-US  11/20/2019  7:45 AM WH-MFC [place] 2 WH-MFCUS MFC-US  11/23/2019 10:30 AM Truett Mainland, DO [facility] None  11/29/2019 10:30 AM Truett Mainland, DO [facility] None    Truett Mainland, DO

## 2019-11-01 NOTE — Progress Notes (Addendum)
PRENATAL VISIT NOTE  Subjective:  Sandra Mason is a 39 y.o. G2P1001 at [redacted]w[redacted]d being seen today for ongoing prenatal care.  She is currently monitored for the following issues for this high-risk pregnancy and has Supervision of other normal pregnancy, antepartum; Hx of pre-eclampsia in prior pregnancy, currently pregnant, unspecified trimester; History of C-section; Class 3 severe obesity due to excess calories without serious comorbidity with body mass index (BMI) of 45.0 to 49.9 in adult [facility]); Chronic hypertension affecting pregnancy; AMA (advanced maternal age) multigravida 64+; and Type 2 diabetes mellitus affecting pregnancy, antepartum on their problem list.  Patient reports no bleeding, no contractions, no cramping and no leaking, no headache, no visual changes.  Contractions: Not present.  .  Movement: Present. Denies leaking of fluid.   The following portions of the patient's history were reviewed and updated as appropriate: allergies, current medications, past family history, past medical history, past social history, past surgical history and problem list.   Objective:   Vitals:   11/01/19 1016  BP: (!) 127/59  Pulse: (!) 102  Weight: 268 lb (121.6 kg)    Fetal Status: Fetal Heart Rate (bpm): 155   Movement: Present     General:  Alert, oriented and cooperative. Patient is in no acute distress.  Skin: Skin is warm and dry. No rash noted.   Cardiovascular: Normal heart rate noted  Respiratory: Normal respiratory effort, no problems with respiration noted  Abdomen: Soft, gravid, appropriate for gestational age.  Pain/Pressure: Absent     Pelvic: Cervical exam deferred        Extremities: Normal range of motion.  Edema: None  Mental Status: Normal mood and affect. Normal behavior. Normal judgment and thought content.   Assessment and Plan:  Pregnancy: G2P1001 at [redacted]w[redacted]d 1. Supervision of other normal pregnancy, antepartum FHT normal.  2. History of C-section Scheduled  c/s for severe preeclampsia. Patient did not labor. Patient would like to try VBAC.  3. Class 3 severe obesity due to excess calories without serious comorbidity with body mass index (BMI) of 45.0 to 49.9 in adult ([place])  4. Type 2 diabetes mellitus affecting pregnancy, antepartum Patient reports good blood sugar control on home glucose monitoring. Continue  fasting and 2 hour post prandial glucose monitoring. Continue current insulin usage -Weekly ultrasounds to monitor fetal growth  5. Chronic hypertension affecting pregnancy Patient reports good blood pressure control on home monitoring. BP in office today 127/59. Continue labetolol. -Weekly BPPs and MFM  6. Hx of pre-eclampsia in prior pregnancy, currently pregnant, unspecified trimester Continue ASA 81mg   7. Multigravida of advanced maternal age in third trimester   Preterm labor symptoms and general obstetric precautions including but not limited to vaginal bleeding, contractions, leaking of fluid and fetal movement were reviewed in detail with the patient. Please refer to After Visit Summary for other counseling recommendations.   Return for OB f/u, GBS.  Future Appointments  Date Time Provider [place]  11/06/2019  3:15 PM [place] MFC-US  11/06/2019  3:15 PM [place] 4 WH-MFCUS MFC-US  11/13/2019  4:00 PM [place] MFC-US  11/13/2019  4:00 PM [place] 3 WH-MFCUS MFC-US  11/15/2019  1:45 PM Truett Mainland, DO [facility] None  11/20/2019  7:45 AM WH-MFC NURSE [place] MFC-US  11/20/2019  7:45 AM WH-MFC [place] 2 WH-MFCUS MFC-US  11/23/2019 10:30 AM Truett Mainland, DO [facility] None  11/29/2019 10:30 AM Truett Mainland, DO [facility] None    Bruna Potter, Medical  Student

## 2019-11-06 ENCOUNTER — Other Ambulatory Visit: Payer: Self-pay

## 2019-11-06 ENCOUNTER — Encounter (HOSPITAL_COMMUNITY): Payer: Self-pay | Admitting: *Deleted

## 2019-11-06 ENCOUNTER — Ambulatory Visit (HOSPITAL_COMMUNITY): Payer: Medicaid Other | Admitting: *Deleted

## 2019-11-06 ENCOUNTER — Ambulatory Visit (HOSPITAL_COMMUNITY)
Admission: RE | Admit: 2019-11-06 | Discharge: 2019-11-06 | Disposition: A | Discharge status: Home / Self Care | Payer: Medicaid Other | Source: Ambulatory Visit | Attending: Obstetrics and Gynecology | Admitting: Obstetrics and Gynecology

## 2019-11-06 DIAGNOSIS — O99213 Obesity complicating pregnancy, third trimester: Secondary | ICD-10-CM

## 2019-11-06 DIAGNOSIS — O10013 Pre-existing essential hypertension complicating pregnancy, third trimester: Secondary | ICD-10-CM | POA: Diagnosis not present

## 2019-11-06 DIAGNOSIS — O10919 Unspecified pre-existing hypertension complicating pregnancy, unspecified trimester: Secondary | ICD-10-CM | POA: Insufficient documentation

## 2019-11-06 DIAGNOSIS — Z3480 Encounter for supervision of other normal pregnancy, unspecified trimester: Secondary | ICD-10-CM

## 2019-11-06 DIAGNOSIS — O09523 Supervision of elderly multigravida, third trimester: Secondary | ICD-10-CM

## 2019-11-06 DIAGNOSIS — O09299 Supervision of pregnancy with other poor reproductive or obstetric history, unspecified trimester: Secondary | ICD-10-CM | POA: Insufficient documentation

## 2019-11-06 DIAGNOSIS — Z794 Long term (current) use of insulin: Secondary | ICD-10-CM | POA: Diagnosis present

## 2019-11-06 DIAGNOSIS — O24113 Pre-existing diabetes mellitus, type 2, in pregnancy, third trimester: Secondary | ICD-10-CM | POA: Insufficient documentation

## 2019-11-06 DIAGNOSIS — O24319 Unspecified pre-existing diabetes mellitus in pregnancy, unspecified trimester: Secondary | ICD-10-CM | POA: Diagnosis present

## 2019-11-06 DIAGNOSIS — Z3A35 35 weeks gestation of pregnancy: Secondary | ICD-10-CM

## 2019-11-13 ENCOUNTER — Ambulatory Visit (HOSPITAL_COMMUNITY)
Admission: RE | Admit: 2019-11-13 | Discharge: 2019-11-13 | Disposition: A | Discharge status: Home / Self Care | Payer: Medicaid Other | Source: Ambulatory Visit | Attending: Obstetrics and Gynecology | Admitting: Obstetrics and Gynecology

## 2019-11-13 ENCOUNTER — Ambulatory Visit (HOSPITAL_COMMUNITY): Payer: Medicaid Other | Admitting: *Deleted

## 2019-11-13 ENCOUNTER — Encounter (HOSPITAL_COMMUNITY): Payer: Self-pay | Admitting: *Deleted

## 2019-11-13 ENCOUNTER — Other Ambulatory Visit: Payer: Self-pay

## 2019-11-13 DIAGNOSIS — Z3A36 36 weeks gestation of pregnancy: Secondary | ICD-10-CM

## 2019-11-13 DIAGNOSIS — O10919 Unspecified pre-existing hypertension complicating pregnancy, unspecified trimester: Secondary | ICD-10-CM

## 2019-11-13 DIAGNOSIS — O99213 Obesity complicating pregnancy, third trimester: Secondary | ICD-10-CM | POA: Diagnosis not present

## 2019-11-13 DIAGNOSIS — O24313 Unspecified pre-existing diabetes mellitus in pregnancy, third trimester: Secondary | ICD-10-CM

## 2019-11-13 DIAGNOSIS — O09299 Supervision of pregnancy with other poor reproductive or obstetric history, unspecified trimester: Secondary | ICD-10-CM | POA: Diagnosis present

## 2019-11-13 DIAGNOSIS — Z794 Long term (current) use of insulin: Secondary | ICD-10-CM | POA: Diagnosis present

## 2019-11-13 DIAGNOSIS — O24319 Unspecified pre-existing diabetes mellitus in pregnancy, unspecified trimester: Secondary | ICD-10-CM | POA: Insufficient documentation

## 2019-11-13 DIAGNOSIS — Z3480 Encounter for supervision of other normal pregnancy, unspecified trimester: Secondary | ICD-10-CM | POA: Diagnosis present

## 2019-11-15 ENCOUNTER — Other Ambulatory Visit (HOSPITAL_COMMUNITY)
Admission: RE | Admit: 2019-11-15 | Discharge: 2019-11-15 | Disposition: A | Discharge status: Home / Self Care | Payer: Medicaid Other | Source: Ambulatory Visit | Attending: Family Medicine | Admitting: Family Medicine

## 2019-11-15 ENCOUNTER — Other Ambulatory Visit: Payer: Self-pay

## 2019-11-15 ENCOUNTER — Ambulatory Visit (INDEPENDENT_AMBULATORY_CARE_PROVIDER_SITE_OTHER): Payer: Medicaid Other | Admitting: Family Medicine

## 2019-11-15 VITALS — BP 137/69 | HR 101 | Wt 268.0 lb

## 2019-11-15 DIAGNOSIS — O10913 Unspecified pre-existing hypertension complicating pregnancy, third trimester: Secondary | ICD-10-CM

## 2019-11-15 DIAGNOSIS — O24119 Pre-existing diabetes mellitus, type 2, in pregnancy, unspecified trimester: Secondary | ICD-10-CM

## 2019-11-15 DIAGNOSIS — O24113 Pre-existing diabetes mellitus, type 2, in pregnancy, third trimester: Secondary | ICD-10-CM

## 2019-11-15 DIAGNOSIS — Z3480 Encounter for supervision of other normal pregnancy, unspecified trimester: Secondary | ICD-10-CM

## 2019-11-15 DIAGNOSIS — O09293 Supervision of pregnancy with other poor reproductive or obstetric history, third trimester: Secondary | ICD-10-CM

## 2019-11-15 DIAGNOSIS — Z98891 History of uterine scar from previous surgery: Secondary | ICD-10-CM

## 2019-11-15 DIAGNOSIS — Z3A36 36 weeks gestation of pregnancy: Secondary | ICD-10-CM

## 2019-11-15 DIAGNOSIS — O09523 Supervision of elderly multigravida, third trimester: Secondary | ICD-10-CM

## 2019-11-15 DIAGNOSIS — O09299 Supervision of pregnancy with other poor reproductive or obstetric history, unspecified trimester: Secondary | ICD-10-CM

## 2019-11-15 DIAGNOSIS — O10919 Unspecified pre-existing hypertension complicating pregnancy, unspecified trimester: Secondary | ICD-10-CM

## 2019-11-15 DIAGNOSIS — O09529 Supervision of elderly multigravida, unspecified trimester: Secondary | ICD-10-CM

## 2019-11-15 DIAGNOSIS — O99213 Obesity complicating pregnancy, third trimester: Secondary | ICD-10-CM

## 2019-11-15 MED ORDER — INSULIN DETEMIR 100 UNIT/ML ~~LOC~~ SOLN: SUBCUTANEOUS | 11 refills | Status: DC

## 2019-11-15 NOTE — Progress Notes (Signed)
Subjective:  Sandra Mason is a 39 y.o. G2P1001 at [redacted]w[redacted]d being seen today for ongoing prenatal care.  She is currently monitored for the following issues for this high-risk pregnancy and has Supervision of other normal pregnancy, antepartum; Hx of pre-eclampsia in prior pregnancy, currently pregnant, unspecified trimester; History of C-section; Class 3 severe obesity due to excess calories without serious comorbidity with body mass index (BMI) of 45.0 to 49.9 in adult [facility]); Chronic hypertension affecting pregnancy; AMA (advanced maternal age) multigravida 56+; and Type 2 diabetes mellitus affecting pregnancy, antepartum on their problem list.  GDM: Patient taking insulin.  Reports no hypoglycemic episodes.  Tolerating medication well Fasting: 95-96 2hr PP: highest 126 after dinner, but only occasionally  Patient reports no complaints.  Contractions: Irregular. Vag. Bleeding: None.  Movement: Present. Denies leaking of fluid.   The following portions of the patient's history were reviewed and updated as appropriate: allergies, current medications, past family history, past medical history, past social history, past surgical history and problem list. Problem list updated.  Objective:   Vitals:   11/15/19 1340  BP: 137/69  Pulse: (!) 101  Weight: 268 lb (121.6 kg)    Fetal Status: Fetal Heart Rate (bpm): 157   Movement: Present  Presentation: Vertex  General:  Alert, oriented and cooperative. Patient is in no acute distress.  Skin: Skin is warm and dry. No rash noted.   Cardiovascular: Normal heart rate noted  Respiratory: Normal respiratory effort, no problems with respiration noted  Abdomen: Soft, gravid, appropriate for gestational age. Pain/Pressure: Present     Pelvic: Vag. Bleeding: None     Cervical exam performed Dilation: Closed Effacement (%): Thick    Extremities: Normal range of motion.  Edema: Trace  Mental Status: Normal mood and affect. Normal behavior. Normal judgment  and thought content.   Urinalysis:      Assessment and Plan:  Pregnancy: G2P1001 at [redacted]w[redacted]d  1. Supervision of other normal pregnancy, antepartum FHT and FH normal - Culture, beta strep (group b only) - GC/Chlamydia probe amp ([place])not at [facility]  2. Type 2 diabetes mellitus affecting pregnancy, antepartum Increase levemir to 25 in AM and 45 in PM. Continue with [place] and antenatal testing. EFW 56% Induce at 39 weeks - insulin detemir (LEVEMIR) 100 UNIT/ML injection; 25 units in the morning and 45 units at night.  Dispense: 10 mL; Refill: 11  3. History of C-section Desires tolac  4. Class 3 severe obesity due to excess calories without serious comorbidity with body mass index (BMI) of 45.0 to 49.9 in adult ([place])  5. Hx of pre-eclampsia in prior pregnancy, currently pregnant, unspecified trimester On ASA 81mg   6. Chronic hypertension affecting pregnancy controlled  7. Antepartum multigravida of advanced maternal age  Preterm labor symptoms and general obstetric precautions including but not limited to vaginal bleeding, contractions, leaking of fluid and fetal movement were reviewed in detail with the patient. Please refer to After Visit Summary for other counseling recommendations.  Return in about 1 week (around 11/22/2019) for OB f/u, In Office.   Truett Mainland, DO

## 2019-11-16 LAB — GC/CHLAMYDIA PROBE AMP (~~LOC~~) NOT AT ARMC
Chlamydia: NEGATIVE
Comment: NEGATIVE
Comment: NORMAL
Neisseria Gonorrhea: NEGATIVE

## 2019-11-18 LAB — CULTURE, BETA STREP (GROUP B ONLY): Strep Gp B Culture: POSITIVE — AB

## 2019-11-19 ENCOUNTER — Encounter: Payer: Self-pay | Admitting: Family Medicine

## 2019-11-19 DIAGNOSIS — B951 Streptococcus, group B, as the cause of diseases classified elsewhere: Secondary | ICD-10-CM | POA: Insufficient documentation

## 2019-11-20 ENCOUNTER — Encounter (HOSPITAL_COMMUNITY): Payer: Self-pay

## 2019-11-20 ENCOUNTER — Ambulatory Visit (HOSPITAL_COMMUNITY): Payer: Medicaid Other | Admitting: *Deleted

## 2019-11-20 ENCOUNTER — Ambulatory Visit (HOSPITAL_COMMUNITY)
Admission: RE | Admit: 2019-11-20 | Discharge: 2019-11-20 | Disposition: A | Discharge status: Home / Self Care | Payer: Medicaid Other | Source: Ambulatory Visit | Attending: Obstetrics and Gynecology | Admitting: Obstetrics and Gynecology

## 2019-11-20 ENCOUNTER — Other Ambulatory Visit (HOSPITAL_COMMUNITY): Payer: Self-pay | Admitting: *Deleted

## 2019-11-20 ENCOUNTER — Other Ambulatory Visit: Payer: Self-pay

## 2019-11-20 DIAGNOSIS — O09299 Supervision of pregnancy with other poor reproductive or obstetric history, unspecified trimester: Secondary | ICD-10-CM | POA: Insufficient documentation

## 2019-11-20 DIAGNOSIS — O24319 Unspecified pre-existing diabetes mellitus in pregnancy, unspecified trimester: Secondary | ICD-10-CM

## 2019-11-20 DIAGNOSIS — O24113 Pre-existing diabetes mellitus, type 2, in pregnancy, third trimester: Secondary | ICD-10-CM

## 2019-11-20 DIAGNOSIS — O34219 Maternal care for unspecified type scar from previous cesarean delivery: Secondary | ICD-10-CM | POA: Diagnosis not present

## 2019-11-20 DIAGNOSIS — O10013 Pre-existing essential hypertension complicating pregnancy, third trimester: Secondary | ICD-10-CM

## 2019-11-20 DIAGNOSIS — Z794 Long term (current) use of insulin: Secondary | ICD-10-CM | POA: Insufficient documentation

## 2019-11-20 DIAGNOSIS — O99213 Obesity complicating pregnancy, third trimester: Secondary | ICD-10-CM

## 2019-11-20 DIAGNOSIS — O09523 Supervision of elderly multigravida, third trimester: Secondary | ICD-10-CM | POA: Diagnosis not present

## 2019-11-20 DIAGNOSIS — O10919 Unspecified pre-existing hypertension complicating pregnancy, unspecified trimester: Secondary | ICD-10-CM

## 2019-11-20 DIAGNOSIS — Z3480 Encounter for supervision of other normal pregnancy, unspecified trimester: Secondary | ICD-10-CM | POA: Diagnosis present

## 2019-11-20 DIAGNOSIS — O3413 Maternal care for benign tumor of corpus uteri, third trimester: Secondary | ICD-10-CM

## 2019-11-20 DIAGNOSIS — Z3A37 37 weeks gestation of pregnancy: Secondary | ICD-10-CM

## 2019-11-23 ENCOUNTER — Other Ambulatory Visit: Payer: Self-pay

## 2019-11-23 ENCOUNTER — Ambulatory Visit (INDEPENDENT_AMBULATORY_CARE_PROVIDER_SITE_OTHER): Payer: Medicaid Other | Admitting: Family Medicine

## 2019-11-23 VITALS — BP 135/81 | HR 90 | Wt 269.0 lb

## 2019-11-23 DIAGNOSIS — Z98891 History of uterine scar from previous surgery: Secondary | ICD-10-CM

## 2019-11-23 DIAGNOSIS — O10913 Unspecified pre-existing hypertension complicating pregnancy, third trimester: Secondary | ICD-10-CM

## 2019-11-23 DIAGNOSIS — Z3480 Encounter for supervision of other normal pregnancy, unspecified trimester: Secondary | ICD-10-CM

## 2019-11-23 DIAGNOSIS — Z3A37 37 weeks gestation of pregnancy: Secondary | ICD-10-CM

## 2019-11-23 DIAGNOSIS — O24119 Pre-existing diabetes mellitus, type 2, in pregnancy, unspecified trimester: Secondary | ICD-10-CM

## 2019-11-23 DIAGNOSIS — Z6841 Body Mass Index (BMI) 40.0 and over, adult: Secondary | ICD-10-CM

## 2019-11-23 DIAGNOSIS — O24113 Pre-existing diabetes mellitus, type 2, in pregnancy, third trimester: Secondary | ICD-10-CM

## 2019-11-23 DIAGNOSIS — E66813 Obesity, class 3: Secondary | ICD-10-CM

## 2019-11-23 DIAGNOSIS — O99213 Obesity complicating pregnancy, third trimester: Secondary | ICD-10-CM

## 2019-11-23 DIAGNOSIS — O10919 Unspecified pre-existing hypertension complicating pregnancy, unspecified trimester: Secondary | ICD-10-CM

## 2019-11-23 LAB — POCT URINALYSIS DIPSTICK
Blood, UA: NEGATIVE
Glucose, UA: NEGATIVE
Ketones, UA: NEGATIVE
Leukocytes, UA: NEGATIVE
Nitrite, UA: NEGATIVE
Protein, UA: POSITIVE — AB
Spec Grav, UA: 1.02 (ref 1.010–1.025)
pH, UA: 6 (ref 5.0–8.0)

## 2019-11-23 NOTE — Progress Notes (Signed)
Subjective:  Sandra Mason is a 39 y.o. G2P1001 at [redacted]w[redacted]d being seen today for ongoing prenatal care.  She is currently monitored for the following issues for this high-risk pregnancy and has Supervision of other normal pregnancy, antepartum; Hx of pre-eclampsia in prior pregnancy, currently pregnant, unspecified trimester; History of C-section; Class 3 severe obesity due to excess calories without serious comorbidity with body mass index (BMI) of 45.0 to 49.9 in adult [facility]); Chronic hypertension affecting pregnancy; AMA (advanced maternal age) multigravida 35+; Type 2 diabetes mellitus affecting pregnancy, antepartum; and Positive GBS test on their problem list.  GDM: Patient taking insulin.  Reports no hypoglycemic episodes.  Tolerating medication well Fasting: 91-93 2hr PP: controlled  Patient reports no complaints.  Contractions: Irregular. Vag. Bleeding: None.  Movement: Present. Denies leaking of fluid.   The following portions of the patient's history were reviewed and updated as appropriate: allergies, current medications, past family history, past medical history, past social history, past surgical history and problem list. Problem list updated.  Objective:   Vitals:   11/23/19 1037  BP: 135/81  Pulse: 90  Weight: 269 lb (122 kg)    Fetal Status: Fetal Heart Rate (bpm): 155   Movement: Present     General:  Alert, oriented and cooperative. Patient is in no acute distress.  Skin: Skin is warm and dry. No rash noted.   Cardiovascular: Normal heart rate noted  Respiratory: Normal respiratory effort, no problems with respiration noted  Abdomen: Soft, gravid, appropriate for gestational age. Pain/Pressure: Present     Pelvic: Vag. Bleeding: None     Cervical exam deferred        Extremities: Normal range of motion.  Edema: Trace  Mental Status: Normal mood and affect. Normal behavior. Normal judgment and thought content.   Urinalysis:      Assessment and Plan:  Pregnancy:  G2P1001 at [redacted]w[redacted]d  1. Supervision of other normal pregnancy, antepartum FHT and FH normal  2. Chronic hypertension affecting pregnancy BP normal on labetalol EFW this week Induce at 39 weeks. - POCT urinalysis dipstick  3. Type 2 diabetes mellitus affecting pregnancy, antepartum Controlled BPP 8/8  4. Class 3 severe obesity due to excess calories without serious comorbidity with body mass index (BMI) of 45.0 to 49.9 in adult ([place])  5. History of C-section Desires TOLAC   Preterm labor symptoms and general obstetric precautions including but not limited to vaginal bleeding, contractions, leaking of fluid and fetal movement were reviewed in detail with the patient. Please refer to After Visit Summary for other counseling recommendations.  Return in about 1 week (around 11/30/2019) for OB f/u, In Office.   Truett Mainland, DO

## 2019-11-26 ENCOUNTER — Encounter (HOSPITAL_COMMUNITY): Payer: Self-pay | Admitting: *Deleted

## 2019-11-26 ENCOUNTER — Telehealth (HOSPITAL_COMMUNITY): Payer: Self-pay | Admitting: *Deleted

## 2019-11-26 NOTE — Telephone Encounter (Signed)
Preadmission screen  

## 2019-11-27 ENCOUNTER — Other Ambulatory Visit: Payer: Self-pay | Admitting: Advanced Practice Midwife

## 2019-11-28 ENCOUNTER — Encounter (HOSPITAL_COMMUNITY): Payer: Self-pay | Admitting: Family Medicine

## 2019-11-28 ENCOUNTER — Inpatient Hospital Stay (HOSPITAL_COMMUNITY): Payer: Medicaid Other | Admitting: Anesthesiology

## 2019-11-28 ENCOUNTER — Encounter (HOSPITAL_COMMUNITY): Payer: Self-pay

## 2019-11-28 ENCOUNTER — Other Ambulatory Visit: Payer: Self-pay

## 2019-11-28 ENCOUNTER — Ambulatory Visit (HOSPITAL_COMMUNITY): Payer: Medicaid Other | Admitting: *Deleted

## 2019-11-28 ENCOUNTER — Inpatient Hospital Stay (HOSPITAL_COMMUNITY)
Admission: AD | Admit: 2019-11-28 | Discharge: 2019-12-04 | DRG: 788 | Disposition: A | Discharge status: Home / Self Care | Payer: Medicaid Other | Attending: Obstetrics & Gynecology | Admitting: Obstetrics & Gynecology

## 2019-11-28 ENCOUNTER — Ambulatory Visit (HOSPITAL_BASED_OUTPATIENT_CLINIC_OR_DEPARTMENT_OTHER)
Admission: RE | Admit: 2019-11-28 | Discharge: 2019-11-28 | Disposition: A | Discharge status: Home / Self Care | Payer: Medicaid Other | Source: Ambulatory Visit | Attending: Obstetrics and Gynecology | Admitting: Obstetrics and Gynecology

## 2019-11-28 ENCOUNTER — Encounter (HOSPITAL_COMMUNITY)
Admission: AD | Disposition: A | Discharge status: Home / Self Care | Payer: Self-pay | Source: Home / Self Care | Attending: Obstetrics and Gynecology

## 2019-11-28 DIAGNOSIS — Z3A38 38 weeks gestation of pregnancy: Secondary | ICD-10-CM

## 2019-11-28 DIAGNOSIS — O1410 Severe pre-eclampsia, unspecified trimester: Secondary | ICD-10-CM

## 2019-11-28 DIAGNOSIS — E119 Type 2 diabetes mellitus without complications: Secondary | ICD-10-CM | POA: Diagnosis present

## 2019-11-28 DIAGNOSIS — O24313 Unspecified pre-existing diabetes mellitus in pregnancy, third trimester: Secondary | ICD-10-CM

## 2019-11-28 DIAGNOSIS — O99213 Obesity complicating pregnancy, third trimester: Secondary | ICD-10-CM

## 2019-11-28 DIAGNOSIS — Z362 Encounter for other antenatal screening follow-up: Secondary | ICD-10-CM | POA: Diagnosis not present

## 2019-11-28 DIAGNOSIS — O09299 Supervision of pregnancy with other poor reproductive or obstetric history, unspecified trimester: Secondary | ICD-10-CM

## 2019-11-28 DIAGNOSIS — O09523 Supervision of elderly multigravida, third trimester: Secondary | ICD-10-CM | POA: Diagnosis not present

## 2019-11-28 DIAGNOSIS — O328XX0 Maternal care for other malpresentation of fetus, not applicable or unspecified: Secondary | ICD-10-CM | POA: Diagnosis present

## 2019-11-28 DIAGNOSIS — O99214 Obesity complicating childbirth: Secondary | ICD-10-CM | POA: Diagnosis present

## 2019-11-28 DIAGNOSIS — Z794 Long term (current) use of insulin: Secondary | ICD-10-CM | POA: Insufficient documentation

## 2019-11-28 DIAGNOSIS — O99824 Streptococcus B carrier state complicating childbirth: Secondary | ICD-10-CM | POA: Diagnosis present

## 2019-11-28 DIAGNOSIS — O1002 Pre-existing essential hypertension complicating childbirth: Secondary | ICD-10-CM | POA: Diagnosis present

## 2019-11-28 DIAGNOSIS — O09529 Supervision of elderly multigravida, unspecified trimester: Secondary | ICD-10-CM

## 2019-11-28 DIAGNOSIS — O34219 Maternal care for unspecified type scar from previous cesarean delivery: Secondary | ICD-10-CM

## 2019-11-28 DIAGNOSIS — Z20822 Contact with and (suspected) exposure to covid-19: Secondary | ICD-10-CM | POA: Diagnosis present

## 2019-11-28 DIAGNOSIS — Z3480 Encounter for supervision of other normal pregnancy, unspecified trimester: Secondary | ICD-10-CM | POA: Insufficient documentation

## 2019-11-28 DIAGNOSIS — O24319 Unspecified pre-existing diabetes mellitus in pregnancy, unspecified trimester: Secondary | ICD-10-CM

## 2019-11-28 DIAGNOSIS — O24119 Pre-existing diabetes mellitus, type 2, in pregnancy, unspecified trimester: Secondary | ICD-10-CM | POA: Diagnosis present

## 2019-11-28 DIAGNOSIS — O1414 Severe pre-eclampsia complicating childbirth: Secondary | ICD-10-CM

## 2019-11-28 DIAGNOSIS — Z98891 History of uterine scar from previous surgery: Secondary | ICD-10-CM

## 2019-11-28 DIAGNOSIS — O34211 Maternal care for low transverse scar from previous cesarean delivery: Secondary | ICD-10-CM | POA: Diagnosis present

## 2019-11-28 DIAGNOSIS — O10919 Unspecified pre-existing hypertension complicating pregnancy, unspecified trimester: Secondary | ICD-10-CM | POA: Insufficient documentation

## 2019-11-28 DIAGNOSIS — O114 Pre-existing hypertension with pre-eclampsia, complicating childbirth: Secondary | ICD-10-CM | POA: Diagnosis present

## 2019-11-28 DIAGNOSIS — O321XX0 Maternal care for breech presentation, not applicable or unspecified: Secondary | ICD-10-CM | POA: Diagnosis not present

## 2019-11-28 DIAGNOSIS — O2412 Pre-existing diabetes mellitus, type 2, in childbirth: Secondary | ICD-10-CM | POA: Diagnosis present

## 2019-11-28 DIAGNOSIS — O3413 Maternal care for benign tumor of corpus uteri, third trimester: Secondary | ICD-10-CM

## 2019-11-28 DIAGNOSIS — I10 Essential (primary) hypertension: Secondary | ICD-10-CM | POA: Diagnosis present

## 2019-11-28 DIAGNOSIS — Z3490 Encounter for supervision of normal pregnancy, unspecified, unspecified trimester: Secondary | ICD-10-CM | POA: Diagnosis present

## 2019-11-28 DIAGNOSIS — O119 Pre-existing hypertension with pre-eclampsia, unspecified trimester: Secondary | ICD-10-CM | POA: Diagnosis present

## 2019-11-28 DIAGNOSIS — O10013 Pre-existing essential hypertension complicating pregnancy, third trimester: Secondary | ICD-10-CM

## 2019-11-28 LAB — COMPREHENSIVE METABOLIC PANEL
ALT: 15 U/L (ref 0–44)
AST: 22 U/L (ref 15–41)
Albumin: 2.7 g/dL — ABNORMAL LOW (ref 3.5–5.0)
Alkaline Phosphatase: 65 U/L (ref 38–126)
Anion gap: 12 (ref 5–15)
BUN: 11 mg/dL (ref 6–20)
CO2: 21 mmol/L — ABNORMAL LOW (ref 22–32)
Calcium: 9.2 mg/dL (ref 8.9–10.3)
Chloride: 102 mmol/L (ref 98–111)
Creatinine, Ser: 0.69 mg/dL (ref 0.44–1.00)
GFR calc Af Amer: >60 mL/min (ref >60)
GFR calc non Af Amer: >60 mL/min (ref >60)
Glucose, Bld: 82 mg/dL (ref 70–99)
Potassium: 4.1 mmol/L (ref 3.5–5.1)
Sodium: 135 mmol/L (ref 135–145)
Total Bilirubin: 0.4 mg/dL (ref 0.3–1.2)
Total Protein: 6.6 g/dL (ref 6.5–8.1)

## 2019-11-28 LAB — CBC
HCT: 38.5 % (ref 36.0–46.0)
HCT: 41.3 % (ref 36.0–46.0)
Hemoglobin: 12.5 g/dL (ref 12.0–15.0)
Hemoglobin: 13.2 g/dL (ref 12.0–15.0)
MCH: 29.6 pg (ref 26.0–34.0)
MCH: 29.5 pg (ref 26.0–34.0)
MCHC: 32.5 g/dL (ref 30.0–36.0)
MCHC: 32 g/dL (ref 30.0–36.0)
MCV: 91.2 fL (ref 80.0–100.0)
MCV: 92.4 fL (ref 80.0–100.0)
Platelets: 313 10*3/uL (ref 150–400)
Platelets: 293 10*3/uL (ref 150–400)
RBC: 4.22 MIL/uL (ref 3.87–5.11)
RBC: 4.47 MIL/uL (ref 3.87–5.11)
RDW: 15 % (ref 11.5–15.5)
RDW: 15 % (ref 11.5–15.5)
WBC: 9.5 10*3/uL (ref 4.0–10.5)
WBC: 14 10*3/uL — ABNORMAL HIGH (ref 4.0–10.5)
nRBC: 0 % (ref 0.0–0.2)
nRBC: 0 % (ref 0.0–0.2)

## 2019-11-28 LAB — GLUCOSE, CAPILLARY
Glucose-Capillary: 66 mg/dL — ABNORMAL LOW (ref 70–99)
Glucose-Capillary: 107 mg/dL — ABNORMAL HIGH (ref 70–99)
Glucose-Capillary: 114 mg/dL — ABNORMAL HIGH (ref 70–99)

## 2019-11-28 LAB — TYPE AND SCREEN
ABO/RH(D): O POS
Antibody Screen: NEGATIVE

## 2019-11-28 LAB — RESPIRATORY PANEL BY RT PCR (FLU A&B, COVID)
Influenza A by PCR: NEGATIVE
Influenza B by PCR: NEGATIVE
SARS Coronavirus 2 by RT PCR: NEGATIVE

## 2019-11-28 LAB — ABO/RH: ABO/RH(D): O POS

## 2019-11-28 LAB — PROTEIN / CREATININE RATIO, URINE
Creatinine, Urine: 133.3 mg/dL
Protein Creatinine Ratio: 1.93 mg/mg{Cre} — ABNORMAL HIGH (ref 0.00–0.15)
Total Protein, Urine: 257 mg/dL

## 2019-11-28 SURGERY — Surgical Case
Anesthesia: Epidural | Site: Abdomen | Wound class: Clean Contaminated

## 2019-11-28 MED ORDER — OXYTOCIN 40 UNITS IN NORMAL SALINE INFUSION - SIMPLE MED
INTRAVENOUS | Status: DC | PRN
  Administered 2019-11-28: 40 mL via INTRAVENOUS
  Administered 2019-11-28: 600 mL via INTRAVENOUS

## 2019-11-28 MED ORDER — OXYTOCIN 40 UNITS IN NORMAL SALINE INFUSION - SIMPLE MED: 1.0000 m[IU]/min | INTRAVENOUS | Status: DC

## 2019-11-28 MED ORDER — DEXTROSE 5 % IV SOLN
INTRAVENOUS | Status: AC
  Filled 2019-11-28: qty 3000

## 2019-11-28 MED ORDER — LABETALOL HCL 5 MG/ML IV SOLN
INTRAVENOUS | Status: AC
  Filled 2019-11-28: qty 8

## 2019-11-28 MED ORDER — TETANUS-DIPHTH-ACELL PERTUSSIS 5-2.5-18.5 LF-MCG/0.5 IM SUSP: 0.5000 mL | Freq: Once | INTRAMUSCULAR | Status: DC

## 2019-11-28 MED ORDER — TERBUTALINE SULFATE 1 MG/ML IJ SOLN: 0.2500 mg | Freq: Once | INTRAMUSCULAR | Status: DC | PRN

## 2019-11-28 MED ORDER — LIDOCAINE HCL (PF) 1 % IJ SOLN: 30.0000 mL | INTRAMUSCULAR | Status: DC | PRN

## 2019-11-28 MED ORDER — ACETAMINOPHEN 325 MG PO TABS: 650.0000 mg | ORAL_TABLET | ORAL | Status: DC | PRN

## 2019-11-28 MED ORDER — DEXTROSE 5 % IV SOLN
3.0000 g | INTRAVENOUS | Status: AC
  Administered 2019-11-28: 3 g via INTRAVENOUS

## 2019-11-28 MED ORDER — LABETALOL HCL 5 MG/ML IV SOLN
40.0000 mg | INTRAVENOUS | Status: DC | PRN
  Administered 2019-11-28 – 2019-12-01 (×2): 40 mg via INTRAVENOUS

## 2019-11-28 MED ORDER — FENTANYL CITRATE (PF) 100 MCG/2ML IJ SOLN: 25.0000 ug | INTRAMUSCULAR | Status: DC | PRN

## 2019-11-28 MED ORDER — OXYCODONE HCL 5 MG PO TABS: 5.0000 mg | ORAL_TABLET | ORAL | Status: DC | PRN

## 2019-11-28 MED ORDER — NALBUPHINE HCL 10 MG/ML IJ SOLN
5.0000 mg | INTRAMUSCULAR | Status: DC | PRN
  Filled 2019-11-28: qty 1

## 2019-11-28 MED ORDER — NALBUPHINE HCL 10 MG/ML IJ SOLN: 5.0000 mg | Freq: Once | INTRAMUSCULAR | Status: DC | PRN

## 2019-11-28 MED ORDER — ONDANSETRON HCL 4 MG/2ML IJ SOLN: 4.0000 mg | Freq: Once | INTRAMUSCULAR | Status: DC | PRN

## 2019-11-28 MED ORDER — SCOPOLAMINE 1 MG/3DAYS TD PT72
MEDICATED_PATCH | TRANSDERMAL | Status: AC
  Filled 2019-11-28: qty 1

## 2019-11-28 MED ORDER — LIDOCAINE 2% (20 MG/ML) 5 ML SYRINGE
INTRAMUSCULAR | Status: AC
  Filled 2019-11-28: qty 5

## 2019-11-28 MED ORDER — ONDANSETRON HCL 4 MG/2ML IJ SOLN: 4.0000 mg | Freq: Three times a day (TID) | INTRAMUSCULAR | Status: DC | PRN

## 2019-11-28 MED ORDER — TERBUTALINE SULFATE 1 MG/ML IJ SOLN
INTRAMUSCULAR | Status: AC
  Filled 2019-11-28: qty 1

## 2019-11-28 MED ORDER — NALOXONE HCL 0.4 MG/ML IJ SOLN: 0.4000 mg | INTRAMUSCULAR | Status: DC | PRN

## 2019-11-28 MED ORDER — SIMETHICONE 80 MG PO CHEW: 80.0000 mg | CHEWABLE_TABLET | ORAL | Status: DC | PRN

## 2019-11-28 MED ORDER — ENOXAPARIN SODIUM 60 MG/0.6ML ~~LOC~~ SOLN
0.5000 mg/kg | SUBCUTANEOUS | Status: DC
  Administered 2019-11-29 – 2019-12-04 (×6): 60 mg via SUBCUTANEOUS
  Filled 2019-11-28 (×6): qty 0.6

## 2019-11-28 MED ORDER — SENNOSIDES-DOCUSATE SODIUM 8.6-50 MG PO TABS
2.0000 | ORAL_TABLET | ORAL | Status: DC
  Administered 2019-11-28 – 2019-12-04 (×5): 2 via ORAL
  Filled 2019-11-28 (×7): qty 2

## 2019-11-28 MED ORDER — OXYCODONE-ACETAMINOPHEN 5-325 MG PO TABS: 2.0000 | ORAL_TABLET | ORAL | Status: DC | PRN

## 2019-11-28 MED ORDER — OXYTOCIN BOLUS FROM INFUSION: 500.0000 mL | Freq: Once | INTRAVENOUS | Status: DC

## 2019-11-28 MED ORDER — KETOROLAC TROMETHAMINE 30 MG/ML IJ SOLN
INTRAMUSCULAR | Status: AC
  Filled 2019-11-28: qty 1

## 2019-11-28 MED ORDER — NALBUPHINE HCL 10 MG/ML IJ SOLN
5.0000 mg | INTRAMUSCULAR | Status: DC | PRN
  Administered 2019-11-29: 5 mg via INTRAVENOUS

## 2019-11-28 MED ORDER — SOD CITRATE-CITRIC ACID 500-334 MG/5ML PO SOLN: 30.0000 mL | ORAL | Status: DC | PRN

## 2019-11-28 MED ORDER — OXYTOCIN 40 UNITS IN NORMAL SALINE INFUSION - SIMPLE MED: 2.5000 [IU]/h | INTRAVENOUS | Status: AC

## 2019-11-28 MED ORDER — HYDRALAZINE HCL 20 MG/ML IJ SOLN
10.0000 mg | INTRAMUSCULAR | Status: DC | PRN
  Filled 2019-11-28 (×4): qty 1

## 2019-11-28 MED ORDER — TRANEXAMIC ACID 1000 MG/10ML IV SOLN
INTRAVENOUS | Status: DC | PRN
  Administered 2019-11-28: 1000 mg via INTRAVENOUS

## 2019-11-28 MED ORDER — HYDRALAZINE HCL 20 MG/ML IJ SOLN
10.0000 mg | INTRAMUSCULAR | Status: DC | PRN
  Administered 2019-11-29: 10 mg via INTRAVENOUS
  Filled 2019-11-28: qty 1

## 2019-11-28 MED ORDER — OXYCODONE-ACETAMINOPHEN 5-325 MG PO TABS: 1.0000 | ORAL_TABLET | ORAL | Status: DC | PRN

## 2019-11-28 MED ORDER — FENTANYL CITRATE (PF) 100 MCG/2ML IJ SOLN
INTRAMUSCULAR | Status: DC | PRN
  Administered 2019-11-28: 50 ug via INTRAVENOUS

## 2019-11-28 MED ORDER — LABETALOL HCL 5 MG/ML IV SOLN
80.0000 mg | INTRAVENOUS | Status: DC | PRN
  Filled 2019-11-28: qty 16

## 2019-11-28 MED ORDER — SIMETHICONE 80 MG PO CHEW
80.0000 mg | CHEWABLE_TABLET | Freq: Three times a day (TID) | ORAL | Status: DC
  Administered 2019-11-29 – 2019-12-04 (×15): 80 mg via ORAL
  Filled 2019-11-28 (×15): qty 1

## 2019-11-28 MED ORDER — ONDANSETRON HCL 4 MG/2ML IJ SOLN: 4.0000 mg | Freq: Four times a day (QID) | INTRAMUSCULAR | Status: DC | PRN

## 2019-11-28 MED ORDER — MAGNESIUM SULFATE BOLUS VIA INFUSION
6.0000 g | Freq: Once | INTRAVENOUS | Status: AC
  Administered 2019-11-28: 6 g via INTRAVENOUS
  Filled 2019-11-28: qty 1000

## 2019-11-28 MED ORDER — SODIUM BICARBONATE 8.4 % IV SOLN
INTRAVENOUS | Status: DC | PRN
  Administered 2019-11-28 (×2): 4 mL via EPIDURAL

## 2019-11-28 MED ORDER — OXYCODONE HCL 5 MG PO TABS: 5.0000 mg | ORAL_TABLET | Freq: Once | ORAL | Status: DC | PRN

## 2019-11-28 MED ORDER — ZOLPIDEM TARTRATE 5 MG PO TABS: 5.0000 mg | ORAL_TABLET | Freq: Every evening | ORAL | Status: DC | PRN

## 2019-11-28 MED ORDER — LACTATED RINGERS IV SOLN: INTRAVENOUS | Status: DC

## 2019-11-28 MED ORDER — MAGNESIUM SULFATE 40 GM/1000ML IV SOLN
2.0000 g/h | INTRAVENOUS | Status: AC
  Administered 2019-11-29: 2 g/h via INTRAVENOUS
  Filled 2019-11-28: qty 1000

## 2019-11-28 MED ORDER — DIPHENHYDRAMINE HCL 25 MG PO CAPS
25.0000 mg | ORAL_CAPSULE | ORAL | Status: DC | PRN
  Administered 2019-11-29: 25 mg via ORAL
  Filled 2019-11-28: qty 1

## 2019-11-28 MED ORDER — DIPHENHYDRAMINE HCL 50 MG/ML IJ SOLN: 12.5000 mg | INTRAMUSCULAR | Status: DC | PRN

## 2019-11-28 MED ORDER — SODIUM CHLORIDE 0.9 % IR SOLN
Status: DC | PRN
  Administered 2019-11-28 (×2): 1

## 2019-11-28 MED ORDER — LACTATED RINGERS IV SOLN: 500.0000 mL | INTRAVENOUS | Status: DC | PRN

## 2019-11-28 MED ORDER — OXYCODONE HCL 5 MG/5ML PO SOLN: 5.0000 mg | Freq: Once | ORAL | Status: DC | PRN

## 2019-11-28 MED ORDER — ONDANSETRON HCL 4 MG/2ML IJ SOLN
INTRAMUSCULAR | Status: DC | PRN
  Administered 2019-11-28: 4 mg via INTRAVENOUS

## 2019-11-28 MED ORDER — KETOROLAC TROMETHAMINE 30 MG/ML IJ SOLN
30.0000 mg | Freq: Once | INTRAMUSCULAR | Status: AC | PRN
  Administered 2019-11-28: 30 mg via INTRAVENOUS

## 2019-11-28 MED ORDER — MIDAZOLAM HCL 2 MG/2ML IJ SOLN
INTRAMUSCULAR | Status: DC | PRN
  Administered 2019-11-28: 1 mg via INTRAVENOUS

## 2019-11-28 MED ORDER — INSULIN ASPART 100 UNIT/ML ~~LOC~~ SOLN
0.0000 [IU] | Freq: Three times a day (TID) | SUBCUTANEOUS | Status: DC
  Administered 2019-11-29 (×2): 1 [IU] via SUBCUTANEOUS

## 2019-11-28 MED ORDER — DIBUCAINE (PERIANAL) 1 % EX OINT: 1.0000 "application " | TOPICAL_OINTMENT | CUTANEOUS | Status: DC | PRN

## 2019-11-28 MED ORDER — DEXTROSE 50 % IV SOLN
12.5000 g | INTRAVENOUS | Status: AC
  Administered 2019-11-28: 12.5 g via INTRAVENOUS

## 2019-11-28 MED ORDER — OXYTOCIN 40 UNITS IN NORMAL SALINE INFUSION - SIMPLE MED: 2.5000 [IU]/h | INTRAVENOUS | Status: DC

## 2019-11-28 MED ORDER — TRANEXAMIC ACID-NACL 1000-0.7 MG/100ML-% IV SOLN: 1000.0000 mg | INTRAVENOUS | Status: DC

## 2019-11-28 MED ORDER — LABETALOL HCL 5 MG/ML IV SOLN
20.0000 mg | INTRAVENOUS | Status: DC | PRN
  Administered 2019-11-28: 20 mg via INTRAVENOUS
  Filled 2019-11-28 (×5): qty 4

## 2019-11-28 MED ORDER — DIPHENHYDRAMINE HCL 25 MG PO CAPS: 25.0000 mg | ORAL_CAPSULE | Freq: Four times a day (QID) | ORAL | Status: DC | PRN

## 2019-11-28 MED ORDER — METFORMIN HCL 500 MG PO TABS
1000.0000 mg | ORAL_TABLET | Freq: Two times a day (BID) | ORAL | Status: DC
  Administered 2019-11-29 – 2019-12-04 (×11): 1000 mg via ORAL
  Filled 2019-11-28 (×12): qty 2

## 2019-11-28 MED ORDER — ENALAPRIL MALEATE 5 MG PO TABS
10.0000 mg | ORAL_TABLET | Freq: Every day | ORAL | Status: DC
  Administered 2019-11-28: 10 mg via ORAL
  Filled 2019-11-28 (×2): qty 2

## 2019-11-28 MED ORDER — OXYTOCIN 40 UNITS IN NORMAL SALINE INFUSION - SIMPLE MED
INTRAVENOUS | Status: AC
  Filled 2019-11-28: qty 1000

## 2019-11-28 MED ORDER — NALOXONE HCL 4 MG/10ML IJ SOLN
1.0000 ug/kg/h | INTRAVENOUS | Status: DC | PRN
  Filled 2019-11-28: qty 5

## 2019-11-28 MED ORDER — PRENATAL MULTIVITAMIN CH
1.0000 | ORAL_TABLET | Freq: Every day | ORAL | Status: DC
  Administered 2019-11-29 – 2019-12-04 (×6): 1 via ORAL
  Filled 2019-11-28 (×6): qty 1

## 2019-11-28 MED ORDER — MEPERIDINE HCL 25 MG/ML IJ SOLN: 6.2500 mg | INTRAMUSCULAR | Status: DC | PRN

## 2019-11-28 MED ORDER — STERILE WATER FOR IRRIGATION IR SOLN
Status: DC | PRN
  Administered 2019-11-28: 1

## 2019-11-28 MED ORDER — PENICILLIN G POT IN DEXTROSE 60000 UNIT/ML IV SOLN: 3.0000 10*6.[IU] | INTRAVENOUS | Status: DC

## 2019-11-28 MED ORDER — MENTHOL 3 MG MT LOZG
1.0000 | LOZENGE | OROMUCOSAL | Status: DC | PRN
  Filled 2019-11-28: qty 9

## 2019-11-28 MED ORDER — ACETAMINOPHEN 325 MG PO TABS
650.0000 mg | ORAL_TABLET | Freq: Four times a day (QID) | ORAL | Status: DC | PRN
  Administered 2019-12-01: 08:00:00 650 mg via ORAL
  Filled 2019-11-28: qty 2

## 2019-11-28 MED ORDER — COCONUT OIL OIL
1.0000 "application " | TOPICAL_OIL | Status: DC | PRN
  Administered 2019-12-03: 1 via TOPICAL

## 2019-11-28 MED ORDER — DEXTROSE 50 % IV SOLN
INTRAVENOUS | Status: AC
  Filled 2019-11-28: qty 50

## 2019-11-28 MED ORDER — SODIUM CHLORIDE 0.9 % IV SOLN: 5.0000 10*6.[IU] | Freq: Once | INTRAVENOUS | Status: DC

## 2019-11-28 MED ORDER — LABETALOL HCL 5 MG/ML IV SOLN
INTRAVENOUS | Status: DC | PRN
  Administered 2019-11-28 (×2): 10 mg via INTRAVENOUS

## 2019-11-28 MED ORDER — FENTANYL CITRATE (PF) 100 MCG/2ML IJ SOLN
INTRAMUSCULAR | Status: AC
  Filled 2019-11-28: qty 2

## 2019-11-28 MED ORDER — SIMETHICONE 80 MG PO CHEW
80.0000 mg | CHEWABLE_TABLET | ORAL | Status: DC
  Administered 2019-11-28 – 2019-12-04 (×6): 80 mg via ORAL
  Filled 2019-11-28 (×6): qty 1

## 2019-11-28 MED ORDER — IBUPROFEN 800 MG PO TABS
800.0000 mg | ORAL_TABLET | Freq: Four times a day (QID) | ORAL | Status: DC
  Administered 2019-11-30 – 2019-12-04 (×19): 800 mg via ORAL
  Filled 2019-11-28 (×19): qty 1

## 2019-11-28 MED ORDER — TERBUTALINE SULFATE 1 MG/ML IJ SOLN: 0.2500 mg | Freq: Once | INTRAMUSCULAR | Status: DC

## 2019-11-28 MED ORDER — KETOROLAC TROMETHAMINE 30 MG/ML IJ SOLN
30.0000 mg | Freq: Four times a day (QID) | INTRAMUSCULAR | Status: AC
  Administered 2019-11-29 (×4): 30 mg via INTRAVENOUS
  Filled 2019-11-28 (×4): qty 1

## 2019-11-28 MED ORDER — WITCH HAZEL-GLYCERIN EX PADS: 1.0000 "application " | MEDICATED_PAD | CUTANEOUS | Status: DC | PRN

## 2019-11-28 MED ORDER — LABETALOL HCL 5 MG/ML IV SOLN
INTRAVENOUS | Status: AC
  Filled 2019-11-28: qty 4

## 2019-11-28 MED ORDER — LABETALOL HCL 5 MG/ML IV SOLN
40.0000 mg | INTRAVENOUS | Status: DC | PRN
  Filled 2019-11-28 (×2): qty 8

## 2019-11-28 MED ORDER — TRANEXAMIC ACID-NACL 1000-0.7 MG/100ML-% IV SOLN
INTRAVENOUS | Status: AC
  Filled 2019-11-28: qty 100

## 2019-11-28 MED ORDER — MORPHINE SULFATE (PF) 0.5 MG/ML IJ SOLN
INTRAMUSCULAR | Status: DC | PRN
  Administered 2019-11-28: 3 mg via EPIDURAL

## 2019-11-28 MED ORDER — MORPHINE SULFATE (PF) 0.5 MG/ML IJ SOLN
INTRAMUSCULAR | Status: AC
  Filled 2019-11-28: qty 10

## 2019-11-28 MED ORDER — ONDANSETRON HCL 4 MG/2ML IJ SOLN
INTRAMUSCULAR | Status: AC
  Filled 2019-11-28: qty 2

## 2019-11-28 MED ORDER — SCOPOLAMINE 1 MG/3DAYS TD PT72
1.0000 | MEDICATED_PATCH | Freq: Once | TRANSDERMAL | Status: AC
  Administered 2019-11-28: 21:00:00 1.5 mg via TRANSDERMAL

## 2019-11-28 MED ORDER — MAGNESIUM SULFATE 40 GM/1000ML IV SOLN
2.0000 g/h | INTRAVENOUS | Status: DC
  Administered 2019-11-28: 2 g/h via INTRAVENOUS
  Filled 2019-11-28: qty 1000

## 2019-11-28 MED ORDER — TERBUTALINE SULFATE 1 MG/ML IJ SOLN
0.2500 mg | Freq: Once | INTRAMUSCULAR | Status: AC
  Administered 2019-11-28: 0.25 mg via SUBCUTANEOUS

## 2019-11-28 MED ORDER — SOD CITRATE-CITRIC ACID 500-334 MG/5ML PO SOLN
30.0000 mL | ORAL | Status: DC | PRN
  Administered 2019-11-28: 17:00:00 30 mL via ORAL

## 2019-11-28 MED ORDER — LIDOCAINE-EPINEPHRINE (PF) 2 %-1:200000 IJ SOLN
INTRAMUSCULAR | Status: DC | PRN
  Administered 2019-11-28 (×3): 5 mg via INTRADERMAL

## 2019-11-28 MED ORDER — LABETALOL HCL 5 MG/ML IV SOLN
80.0000 mg | INTRAVENOUS | Status: DC | PRN
  Administered 2019-11-28: 22:00:00 80 mg via INTRAVENOUS

## 2019-11-28 MED ORDER — LACTATED RINGERS IV SOLN
INTRAVENOUS | Status: DC
  Administered 2019-11-29: 75 mL/h via INTRAVENOUS

## 2019-11-28 MED ORDER — LABETALOL HCL 5 MG/ML IV SOLN
20.0000 mg | INTRAVENOUS | Status: DC | PRN
  Administered 2019-11-28 – 2019-12-01 (×4): 20 mg via INTRAVENOUS
  Filled 2019-11-28 (×2): qty 4

## 2019-11-28 MED ORDER — MAGNESIUM SULFATE 50 % IJ SOLN
INTRAVENOUS | Status: DC | PRN
  Administered 2019-11-28: 2 g via INTRAVENOUS

## 2019-11-28 MED ORDER — SODIUM CHLORIDE 0.9% FLUSH: 3.0000 mL | INTRAVENOUS | Status: DC | PRN

## 2019-11-28 MED ORDER — PHENYLEPHRINE 40 MCG/ML (10ML) SYRINGE FOR IV PUSH (FOR BLOOD PRESSURE SUPPORT)
PREFILLED_SYRINGE | INTRAVENOUS | Status: AC
  Filled 2019-11-28: qty 10

## 2019-11-28 MED ORDER — MIDAZOLAM HCL 2 MG/2ML IJ SOLN
INTRAMUSCULAR | Status: AC
  Filled 2019-11-28: qty 2

## 2019-11-28 MED ORDER — ACETAMINOPHEN 325 MG PO TABS
650.0000 mg | ORAL_TABLET | Freq: Once | ORAL | Status: AC
  Administered 2019-11-28: 11:00:00 650 mg via ORAL
  Filled 2019-11-28: qty 2

## 2019-11-28 SURGICAL SUPPLY — 45 items
BENZOIN TINCTURE PRP APPL 2/3 (GAUZE/BANDAGES/DRESSINGS) ×3 IMPLANT
CHLORAPREP W/TINT 26ML (MISCELLANEOUS) ×3 IMPLANT
CLAMP CORD UMBIL (MISCELLANEOUS) IMPLANT
CLOSURE WOUND 1/2 X4 (GAUZE/BANDAGES/DRESSINGS) ×1
CLOTH BEACON ORANGE TIMEOUT ST (SAFETY) ×3 IMPLANT
DRESSING PREVENA PLUS CUSTOM (GAUZE/BANDAGES/DRESSINGS) ×1 IMPLANT
DRSG OPSITE POSTOP 4X10 (GAUZE/BANDAGES/DRESSINGS) ×3 IMPLANT
DRSG PREVENA PLUS CUSTOM (GAUZE/BANDAGES/DRESSINGS) ×3
ELECT REM PT RETURN 9FT ADLT (ELECTROSURGICAL) ×3
ELECTRODE REM PT RTRN 9FT ADLT (ELECTROSURGICAL) ×1 IMPLANT
EXTENDER TRAXI PANNICULUS (MISCELLANEOUS) ×2 IMPLANT
EXTRACTOR VACUUM M CUP 4 TUBE (SUCTIONS) IMPLANT
EXTRACTOR VACUUM M CUP 4' TUBE (SUCTIONS)
GLOVE BIOGEL PI IND STRL 7.0 (GLOVE) ×2 IMPLANT
GLOVE BIOGEL PI IND STRL 7.5 (GLOVE) ×2 IMPLANT
GLOVE BIOGEL PI INDICATOR 7.0 (GLOVE) ×4
GLOVE BIOGEL PI INDICATOR 7.5 (GLOVE) ×4
GLOVE ECLIPSE 7.5 STRL STRAW (GLOVE) ×3 IMPLANT
GOWN STRL REUS W/TWL LRG LVL3 (GOWN DISPOSABLE) ×9 IMPLANT
HOVERMATT SINGLE USE (MISCELLANEOUS) ×3 IMPLANT
KIT ABG SYR 3ML LUER SLIP (SYRINGE) ×3 IMPLANT
NEEDLE HYPO 25X5/8 SAFETYGLIDE (NEEDLE) ×3 IMPLANT
NS IRRIG 1000ML POUR BTL (IV SOLUTION) ×3 IMPLANT
PACK C SECTION WH (CUSTOM PROCEDURE TRAY) ×3 IMPLANT
PAD OB MATERNITY 4.3X12.25 (PERSONAL CARE ITEMS) ×3 IMPLANT
PENCIL SMOKE EVAC W/HOLSTER (ELECTROSURGICAL) ×3 IMPLANT
RETRACTOR TRAXI PANNICULUS (MISCELLANEOUS) ×1 IMPLANT
RTRCTR C-SECT PINK 25CM LRG (MISCELLANEOUS) ×3 IMPLANT
STRIP CLOSURE SKIN 1/2X4 (GAUZE/BANDAGES/DRESSINGS) ×2 IMPLANT
SUT MNCRL 0 VIOLET CTX 36 (SUTURE) ×5 IMPLANT
SUT MONOCRYL 0 CTX 36 (SUTURE) ×10
SUT PLAIN 0 NONE (SUTURE) ×3 IMPLANT
SUT PLAIN 2 0 XLH (SUTURE) ×3 IMPLANT
SUT VIC AB 0 CT1 36 (SUTURE) ×3 IMPLANT
SUT VIC AB 0 CTX 36 (SUTURE) ×2
SUT VIC AB 0 CTX36XBRD ANBCTRL (SUTURE) ×1 IMPLANT
SUT VIC AB 2-0 CT1 (SUTURE) ×6 IMPLANT
SUT VIC AB 2-0 CT1 27 (SUTURE) ×2
SUT VIC AB 2-0 CT1 TAPERPNT 27 (SUTURE) ×1 IMPLANT
SUT VIC AB 4-0 KS 27 (SUTURE) ×3 IMPLANT
TOWEL OR 17X24 6PK STRL BLUE (TOWEL DISPOSABLE) ×3 IMPLANT
TRAXI PANNICULUS EXTENDER (MISCELLANEOUS) ×4
TRAXI PANNICULUS RETRACTOR (MISCELLANEOUS) ×2
TRAY FOLEY W/BAG SLVR 14FR LF (SET/KITS/TRAYS/PACK) ×3 IMPLANT
WATER STERILE IRR 1000ML POUR (IV SOLUTION) ×3 IMPLANT

## 2019-11-28 NOTE — H&P (Signed)
Obstetric History and Physical  Sandra Mason is a 39 y.o. G2P1001 with IUP at 19w3dpresenting for uncontrolled CHTN vs CHTN si preeclampsia. Has preexisting hypertension & is currently taking labetalol 400 mg BID (last dose this morning). BPs have been well controlled. Was in MFM this morning for ultrasound & had severe range BP. In MAU had 1 severe range BP, rest of blood pressures were >140/90. Mild headache unrelieved with tylenol (3/10) & no other symptoms. Labs were normal but urine PCR is 1.92. Had 1400 on baseline protein creatinine ratio at beginning of pregnancy.    Prenatal Course Source of Care: [facility]  with onset of care at 10 weeks Pregnancy complications or risks: CHTN & T2DM on insulin  Patient Active Problem List   Diagnosis Date Noted  . Positive GBS test 11/19/2019  . Type 2 diabetes mellitus affecting pregnancy, antepartum 06/15/2019  . AMA (advanced maternal age) multigravida 35+ 06/10/2019  . Chronic hypertension affecting pregnancy 05/31/2019  . Supervision of other normal pregnancy, antepartum 05/17/2019  . Hx of pre-eclampsia in prior pregnancy, currently pregnant, unspecified trimester 05/17/2019  . History of C-section 05/17/2019  . Class 3 severe obesity due to excess calories without serious comorbidity with body mass index (BMI) of 45.0 to 49.9 in adult ([place] 05/17/2019   She plans to breastfeed She desires BTL vs vasectomy for postpartum contraception.   Prenatal labs and studies: ABO, Rh: O/Positive/-- (10/08 1626) Antibody: Negative (10/08 1626) Rubella: 17.40 (10/08 1626) RPR: Non Reactive (02/12 1058)  HBsAg: Negative (10/08 1626)  HIV: Non Reactive (02/12 1058)  GOZD:GUYQIHKV/- (04/08 1414) 2hr GTT:  Preexisting diabetes Genetic screening insufficient cells x 2 for NIPS, AFP normal Anatomy [place]ormal  Prenatal Transfer Tool  Maternal Diabetes: Yes:  Diabetes Type:  Insulin/Medication controlled Genetic Screening: Normal Maternal  Ultrasounds/Referrals: Normal Fetal Ultrasounds or other Referrals:  None Maternal Substance Abuse:  No Significant Maternal Medications:  None Significant Maternal Lab Results: Group B Strep positive  Past Medical History:  Diagnosis Date  . Diabetes mellitus without complication ([place]   . Gestational diabetes   . Hypertension     Past Surgical History:  Procedure Laterality Date  . CESAREAN SECTION      OB History  Gravida Para Term Preterm AB Living  '2 1 1     1  '$ SAB TAB Ectopic Multiple Live Births          1    # Outcome Date GA Lbr Len/2nd Weight Sex Delivery Anes PTL Lv  2 Current           1 Term 2016 317w0d F CS-LTranv EPI N LIV    Social History   Socioeconomic History  . Marital status: Married    Spouse name: Not on file  . Number of children: Not on file  . Years of education: Not on file  . Highest education level: Not on file  Occupational History  . Not on file  Tobacco Use  . Smoking status: Never Smoker  . Smokeless tobacco: Never Used  Substance and Sexual Activity  . Alcohol use: Never  . Drug use: Never  . Sexual activity: Yes    Birth control/protection: None  Other Topics Concern  . Not on file  Social History Narrative  . Not on file   Social Determinants of Health   Financial Resource Strain:   . Difficulty of Paying Living Expenses:   Food Insecurity:   . Worried About RuCharity fundraisern  the Last Year:   . [place] in the Last Year:   Transportation Needs:   . Film/video editor (Medical):   Marland Kitchen Lack of Transportation (Non-Medical):   Physical Activity:   . Days of Exercise per Week:   . Minutes of Exercise per Session:   Stress:   . Feeling of Stress :   Social Connections:   . Frequency of Communication with Friends and Family:   . Frequency of Social Gatherings with Friends and Family:   . Attends Religious Services:   . Active Member of Clubs or Organizations:   . Attends Archivist Meetings:    Marland Kitchen Marital Status:     Family History  Problem Relation Age of Onset  . Diabetes Mother   . Hypertension Mother   . Lung cancer Father     Medications Prior to Admission  Medication Sig Dispense Refill Last Dose  . Ascorbic Acid (VITAMIN C) 1000 MG tablet Take 1,000 mg by mouth daily.   11/28/2019 at Unknown time  . aspirin EC 81 MG tablet Take 81 mg by mouth daily.   11/28/2019 at Unknown time  . insulin detemir (LEVEMIR) 100 UNIT/ML injection 25 units in the morning and 45 units at night. 10 mL 11 11/28/2019 at Unknown time  . insulin lispro (HUMALOG) 100 UNIT/ML injection Inject 0.15-0.2 mLs (15-20 Units total) into the skin 3 (three) times daily before meals. 20 in AM and 15 with lunch and dinner 10 mL 11 11/28/2019 at Unknown time  . labetalol (NORMODYNE) 200 MG tablet Take 2 tablets (400 mg total) by mouth 2 (two) times daily. 60 tablet 4 11/28/2019 at Unknown time  . Prenatal Vit-Fe Fumarate-FA (PRENATAL VITAMINS) 28-0.8 MG TABS Take 0.8 mg by mouth daily. 30 tablet 11 11/28/2019 at Unknown time  . Accu-Chek FastClix Lancets MISC 1 Units by Percutaneous route 4 (four) times daily. 100 each 12   . Blood Glucose Monitoring Suppl (ACCU-CHEK NANO SMARTVIEW) w/Device KIT 1 kit by Subdermal route as directed. Check blood sugars for fasting, and two hours after breakfast, lunch and dinner (4 checks daily) 1 kit 0   . glucose blood test strip DX: O24.419 Gestational diabetes check BS QID. 100 each 11   . Insulin Syringe-Needle U-100 (INSULIN SYRINGE .5CC/31GX5/16") 31G X 5/16" 0.5 ML MISC 1 Units by Does not apply route 5 (five) times daily. 100 each 11   . Prenatal Vit-Fe Fumarate-FA (PRENATAL VITAMINS) 28-0.8 MG TABS Take 0.8 mg by mouth daily. 30 tablet 11     No Known Allergies  Review of Systems: Negative except for what is mentioned in HPI.  Physical Exam: BP (!) 154/74   Pulse 75   Temp 99.2 F (37.3 C) (Oral)   Resp 20   Ht '5\' 1"'$  (1.549 m)   Wt 122.9 kg   LMP 03/04/2019 (Exact  Date)   SpO2 99%   BMI 51.21 kg/m  CONSTITUTIONAL: Well-developed, well-nourished female in no acute distress.  HENT:  Normocephalic, atraumatic, External right and left ear normal. Oropharynx is clear and moist EYES: Conjunctivae and EOM are normal. Pupils are equal, round, and reactive to light. No scleral icterus.  NECK: Normal range of motion, supple, no masses SKIN: Skin is warm and dry. No rash noted. Not diaphoretic. No erythema. No pallor. NEUROLOGIC: Alert and oriented to person, place, and time. Normal reflexes, muscle tone coordination. No cranial nerve deficit noted. PSYCHIATRIC: Normal mood and affect. Normal behavior. Normal judgment and thought content. CARDIOVASCULAR:  Normal heart rate noted, regular rhythm RESPIRATORY: Effort and breath sounds normal, no problems with respiration noted ABDOMEN: Soft, nontender, nondistended, gravid. MUSCULOSKELETAL: Normal range of motion. No edema and no tenderness. 2+ distal pulses.  Cervical Exam:     Presentation: breech per ultrasound earlier today FHT:  NST:  Baseline: 145 bpm, Variability: Good {> 6 bpm), Accelerations: Reactive and Decelerations: 90 second decel x1   Pertinent Labs/Studies:   Results for orders placed or performed during the hospital encounter of 11/28/19 (from the past 24 hour(s))  Protein / creatinine ratio, urine     Status: Abnormal   Collection Time: 11/28/19 10:11 AM  Result Value Ref Range   Creatinine, Urine 133.30 mg/dL   Total Protein, Urine 257 mg/dL   Protein Creatinine Ratio 1.93 (H) 0.00 - 0.15 mg/mg[Cre]  CBC     Status: None   Collection Time: 11/28/19 10:14 AM  Result Value Ref Range   WBC 9.5 4.0 - 10.5 K/uL   RBC 4.22 3.87 - 5.11 MIL/uL   Hemoglobin 12.5 12.0 - 15.0 g/dL   HCT 38.5 36.0 - 46.0 %   MCV 91.2 80.0 - 100.0 fL   MCH 29.6 26.0 - 34.0 pg   MCHC 32.5 30.0 - 36.0 g/dL   RDW 15.0 11.5 - 15.5 %   Platelets 313 150 - 400 K/uL   nRBC 0.0 0.0 - 0.2 %  Comprehensive metabolic  panel     Status: Abnormal   Collection Time: 11/28/19 10:14 AM  Result Value Ref Range   Sodium 135 135 - 145 mmol/L   Potassium 4.1 3.5 - 5.1 mmol/L   Chloride 102 98 - 111 mmol/L   CO2 21 (L) 22 - 32 mmol/L   Glucose, Bld 82 70 - 99 mg/dL   BUN 11 6 - 20 mg/dL   Creatinine, Ser 0.69 0.44 - 1.00 mg/dL   Calcium 9.2 8.9 - 10.3 mg/dL   Total Protein 6.6 6.5 - 8.1 g/dL   Albumin 2.7 (L) 3.5 - 5.0 g/dL   AST 22 15 - 41 U/L   ALT 15 0 - 44 U/L   Alkaline Phosphatase 65 38 - 126 U/L   Total Bilirubin 0.4 0.3 - 1.2 mg/dL   GFR calc non Af Amer >60 >60 mL/min   GFR calc Af Amer >60 >60 mL/min   Anion gap 12 5 - 15    Assessment : Madelynne Lasker is a 39 y.o. G2P1001 at 92w3dbeing admitted   Plan: Dr. WSi Raideron unit to speak with patient regarding mode of delivery   LJorje Guild NP

## 2019-11-28 NOTE — Procedures (Addendum)
After informed verbal consent, Terbutaline 0.25 mg SQ given, ECV was attempted under Ultrasound guidance.  Three attempts made and unsuccessful.   FHR was reactive before and after the procedure.   Pt. Tolerated the procedure well. Will proceed with c-section for breech presentation and cHTN with superimposed severe Pre-E. Patient also desires bilateral tubal sterilization. The risks of cesarean section were discussed with the patient including but were not limited to: bleeding which may require transfusion or reoperation; infection which may require antibiotics; injury to bowel, bladder, ureters or other surrounding organs; injury to the fetus; need for additional procedures including hysterectomy in the event of a life-threatening hemorrhage; placental abnormalities wth subsequent pregnancies, incisional problems, thromboembolic phenomenon and other postoperative/anesthesia complications.  Patient also desires permanent sterilization.  Other reversible forms of contraception were discussed with patient; she declines all other modalities. Risks of procedure discussed with patient including but not limited to: risk of regret, permanence of method, bleeding, infection, injury to surrounding organs and need for additional procedures.  Failure risk of about 1% with increased risk of ectopic gestation if pregnancy occurs was also discussed with patient.  Also discussed possibility of post-tubal pain syndrome. Discussed possible salpingectomy for purpose of sterilization and patient agreeable if possible. The patient concurred with the proposed plan, giving informed written consent for the procedures.  Patient has been NPO since midnight she will remain NPO for procedure. Anesthesia and OR aware.  Preoperative prophylactic antibiotics and SCDs ordered on call to the OR.  To OR when ready.  Barrington Ellison, MD [facility] Family Medicine Fellow, [facility] for [employer], [place]

## 2019-11-28 NOTE — Progress Notes (Signed)
   11/28/19 2135  Vital Signs  BP (!) 168/76  BP Location Right Wrist

## 2019-11-28 NOTE — Op Note (Signed)
Cesarean Section Procedure Note   Sandra Mason  11/28/2019  Indications: Breech Presentation with failed version, cHTN with superimposed Severe Pre-E  Pre-operative Diagnosis: repeat c-section with BTL Breech Pre-E.   Post-operative Diagnosis: Repeat C-section x2; unable to complete tubal due to body habitus/poor visualization  Surgeon: Surgeon(s) and Role:    * Eure, Mertie Clause, MD - Primary    * Valon Glasscock, Marin Shutter, MD - Fellow   Anesthesia: epidural    Estimated Blood Loss: 444 mL   Total IV Fluids: 100 mL  Urine Output: 300 mL  Specimens: Placenta sent to L&D  Findings: Viable female infant in double footling breech presentation. Clear amniotic fluid.  Intact placenta, three vessel cord.  Normal uterus. Unable to visualize ovaries and tubes bilaterally.   Baby condition / location:  Nursery  APGAR (1 MIN): 5   APGAR (5 MINS): 8   APGAR (10 MINS):   Weight pending Arterial cord pH: [id number]  Complications: no complications  Indications: Sandra Mason is a 39 y.o. [id number] with an IUP [redacted]w[redacted]d presenting with headache and elevated blood pressures and found to have cHTN with superimposed severe Preeclampsia. Fetus in breech presentation in setting of prior c-section. Version attempted and unsuccessful.   The risks, benefits, complications, treatment options, and expected outcomes were discussed with the patient . The patient concurred with the proposed plan, giving informed consent. identified as Sandra Mason and the procedure verified as C-Section Delivery.  Procedure Details: A Time Out was held and the above information confirmed.  The patient was taken back to the operative suite where epidural anesthesia was placed.  After induction of anesthesia, the patient was draped and prepped in the usual sterile manner and placed in a dorsal supine position with a leftward tilt. A Pfhannestiel was made and carried down through the deep subcutaneous tissue to the fascia. The fascia  was incised in the midline, and this incision was extended bilaterally using the Mayo scissors.  Kocher clamps were applied to the superior aspect of the fascial incision and the underlying rectus muscles were dissected off bluntly and sharply.  A similar process was carried out on the inferior aspect of the fascial incision. The rectus muscles were separated in the midline and the peritoneum was entered bluntly. Attention was turned to the lower uterine segment where a low transverse hysterotomy was made with a scalpel and extended bilaterally bluntly. Initially, right foot and hand presented and overall difficult extraction of infant, but successfully delivered. Cord gas obtained. The cord was clamped and cut immediately and the infant was handed over to the awaiting neonatology team. Uterine massage was then administered, and the placenta delivered intact with a three-vessel cord. The uterus was then cleared of clots and debris.  The hysterotomy was closed with 0 Vicryl in a running locked fashion, and an imbricating layer was also placed with interrupted 0 Vicryl.  The pelvis was cleared of all clot and debris. Hemostasis was confirmed on all surfaces.  The retractor was removed. The fascia was then closed using 0 Vicryl in a running fashion.  The subcutaneous layer was irrigated, reapproximated with 2-0 plain gut interrupted stitches, and the skin was closed with a 4-0 Vicryl subcuticular stitch. The patient tolerated the procedure well. Sponge, instrument and needle counts were correct x 3.  She was taken to the recovery room in stable condition.   Disposition: PACU - hemodynamically stable.   Maternal Condition: stable   Barrington Ellison, MD [facility] Family Medicine Fellow, [facility]  for [employer], [place]

## 2019-11-28 NOTE — Transfer of Care (Signed)
Immediate Anesthesia Transfer of Care Note  Patient: Sandra Mason  Procedure(s) Performed: CESAREAN SECTION WITH BILATERAL TUBAL LIGATION (N/A Abdomen)  Patient Location: PACU  Anesthesia Type:Epidural  Level of Consciousness: awake, alert  and oriented  Airway & Oxygen Therapy: Patient Spontanous Breathing  Post-op Assessment: Report given to RN and Post -op Vital signs reviewed and stable  Post vital signs: Reviewed and stable  Last Vitals:  Vitals Value Taken Time  BP 143/94 11/28/19 1921  Temp    Pulse 90 11/28/19 1925  Resp 27 11/28/19 1925  SpO2 100 % 11/28/19 1925  Vitals shown include unvalidated device data.  Last Pain:  Vitals:   11/28/19 1725  TempSrc:   PainSc: 0-No pain         Complications: No apparent anesthesia complications

## 2019-11-28 NOTE — H&P (Signed)
LABOR AND DELIVERY ADMISSION HISTORY AND PHYSICAL NOTE  Sandra Mason is a 39 y.o. female G2P1001 with IUP at 47w3dby L/12 presenting referred from MFM for elevated bp.   She reports positive fetal movement. She denies leakage of fluid or vaginal bleeding.  She reports mild headache for the past 2-3 days. No visual symptoms or abd pain or new SOB. Denies history of this headache during pregnancy. She reports compliance with BP meds. She reports home bps throughout pregnancy of systolics 1093-818E but for the past 2 days have been running 150s to 160s.   At MFM u/s today, 8/8 bpp, bp 169/78 and then 156/90.  Desires TOLAC. First c/s for breech presentation in setting of preeclampsia. Performed in [place] term, pt not aware of complications.  Last food was last night. Last drank (water and juice) 7 AM this morning.   Declines blood products.  Prenatal History/Complications:  Past Medical History: Past Medical History:  Diagnosis Date  . Diabetes mellitus without complication ([employer]   . Gestational diabetes   . Hypertension     Past Surgical History: Past Surgical History:  Procedure Laterality Date  . CESAREAN SECTION      Obstetrical History: OB History    Gravida  2   Para  1   Term  1   Preterm      AB      Living  1     SAB      TAB      Ectopic      Multiple      Live Births  1           Social History: Social History   Socioeconomic History  . Marital status: Married    Spouse name: Not on file  . Number of children: Not on file  . Years of education: Not on file  . Highest education level: Not on file  Occupational History  . Not on file  Tobacco Use  . Smoking status: Never Smoker  . Smokeless tobacco: Never Used  Substance and Sexual Activity  . Alcohol use: Never  . Drug use: Never  . Sexual activity: Yes    Birth control/protection: None  Other Topics Concern  . Not on file  Social History Narrative  . Not on file    Social Determinants of Health   Financial Resource Strain:   . Difficulty of Paying Living Expenses:   Food Insecurity:   . Worried About RCharity fundraiserin the Last Year:   . RArboriculturistin the Last Year:   Transportation Needs:   . LFilm/video editor(Medical):   .Marland KitchenLack of Transportation (Non-Medical):   Physical Activity:   . Days of Exercise per Week:   . Minutes of Exercise per Session:   Stress:   . Feeling of Stress :   Social Connections:   . Frequency of Communication with Friends and Family:   . Frequency of Social Gatherings with Friends and Family:   . Attends Religious Services:   . Active Member of Clubs or Organizations:   . Attends CArchivistMeetings:   .Marland KitchenMarital Status:     Family History: Family History  Problem Relation Age of Onset  . Diabetes Mother   . Hypertension Mother   . Lung cancer Father     Allergies: No Known Allergies  Medications Prior to Admission  Medication Sig Dispense Refill Last Dose  . Ascorbic Acid (VITAMIN  C) 1000 MG tablet Take 1,000 mg by mouth daily.   11/28/2019 at Unknown time  . aspirin EC 81 MG tablet Take 81 mg by mouth daily.   11/28/2019 at Unknown time  . insulin detemir (LEVEMIR) 100 UNIT/ML injection 25 units in the morning and 45 units at night. 10 mL 11 11/28/2019 at Unknown time  . insulin lispro (HUMALOG) 100 UNIT/ML injection Inject 0.15-0.2 mLs (15-20 Units total) into the skin 3 (three) times daily before meals. 20 in AM and 15 with lunch and dinner 10 mL 11 11/28/2019 at Unknown time  . labetalol (NORMODYNE) 200 MG tablet Take 2 tablets (400 mg total) by mouth 2 (two) times daily. 60 tablet 4 11/28/2019 at Unknown time  . Prenatal Vit-Fe Fumarate-FA (PRENATAL VITAMINS) 28-0.8 MG TABS Take 0.8 mg by mouth daily. 30 tablet 11 11/28/2019 at Unknown time  . Accu-Chek FastClix Lancets MISC 1 Units by Percutaneous route 4 (four) times daily. 100 each 12   . Blood Glucose Monitoring Suppl  (ACCU-CHEK NANO SMARTVIEW) w/Device KIT 1 kit by Subdermal route as directed. Check blood sugars for fasting, and two hours after breakfast, lunch and dinner (4 checks daily) 1 kit 0   . glucose blood test strip DX: O24.419 Gestational diabetes check BS QID. 100 each 11   . Insulin Syringe-Needle U-100 (INSULIN SYRINGE .5CC/31GX5/16") 31G X 5/16" 0.5 ML MISC 1 Units by Does not apply route 5 (five) times daily. 100 each 11   . Prenatal Vit-Fe Fumarate-FA (PRENATAL VITAMINS) 28-0.8 MG TABS Take 0.8 mg by mouth daily. 30 tablet 11      Review of Systems   All systems reviewed and negative except as stated in HPI  Blood pressure (!) 154/74, pulse 75, temperature 99.2 F (37.3 C), temperature source Oral, resp. rate 20, height '5\' 1"'$  (1.549 m), weight 122.9 kg, last menstrual period 03/04/2019, SpO2 99 %. General appearance: alert, cooperative and appears stated age Lungs: clear to auscultation bilaterally Heart: regular rate and rhythm Abdomen: soft, non-tender; bowel sounds normal Extremities: No calf swelling or tenderness Presentation: breech Fetal monitoring: 125/mod/+a/-d Uterine activity: quiet     Prenatal labs: ABO, Rh: O/Positive/-- (10/08 1626) Antibody: Negative (10/08 1626) Rubella: 17.40 (10/08 1626) RPR: Non Reactive (02/12 1058)  HBsAg: Negative (10/08 1626)  HIV: Non Reactive (02/12 1058)  GBS: Positive/-- (04/08 1414)  1 hr Glucola: n/a Genetic screening:  Low fetal fraction on NIPS x2 Anatomy US: wnl  Prenatal Transfer Tool  Maternal Diabetes: Yes:  Diabetes Type:  Pre-pregnancy Genetic Screening: Normal Maternal Ultrasounds/Referrals: Normal Fetal Ultrasounds or other Referrals:  Fetal echo Maternal Substance Abuse:  No Significant Maternal Medications:  Insulin, labetalol Significant Maternal Lab Results: Group B Strep positive  Results for orders placed or performed during the hospital encounter of 11/28/19 (from the past 24 hour(s))  Protein /  creatinine ratio, urine   Collection Time: 11/28/19 10:11 AM  Result Value Ref Range   Creatinine, Urine 133.30 mg/dL   Total Protein, Urine 257 mg/dL   Protein Creatinine Ratio 1.93 (H) 0.00 - 0.15 mg/mg[Cre]  CBC   Collection Time: 11/28/19 10:14 AM  Result Value Ref Range   WBC 9.5 4.0 - 10.5 K/uL   RBC 4.22 3.87 - 5.11 MIL/uL   Hemoglobin 12.5 12.0 - 15.0 g/dL   HCT 38.5 36.0 - 46.0 %   MCV 91.2 80.0 - 100.0 fL   MCH 29.6 26.0 - 34.0 pg   MCHC 32.5 30.0 - 36.0 g/dL   RDW 15.0  11.5 - 15.5 %   Platelets 313 150 - 400 K/uL   nRBC 0.0 0.0 - 0.2 %  Comprehensive metabolic panel   Collection Time: 11/28/19 10:14 AM  Result Value Ref Range   Sodium 135 135 - 145 mmol/L   Potassium 4.1 3.5 - 5.1 mmol/L   Chloride 102 98 - 111 mmol/L   CO2 21 (L) 22 - 32 mmol/L   Glucose, Bld 82 70 - 99 mg/dL   BUN 11 6 - 20 mg/dL   Creatinine, Ser 0.69 0.44 - 1.00 mg/dL   Calcium 9.2 8.9 - 10.3 mg/dL   Total Protein 6.6 6.5 - 8.1 g/dL   Albumin 2.7 (L) 3.5 - 5.0 g/dL   AST 22 15 - 41 U/L   ALT 15 0 - 44 U/L   Alkaline Phosphatase 65 38 - 126 U/L   Total Bilirubin 0.4 0.3 - 1.2 mg/dL   GFR calc non Af Amer >60 >60 mL/min   GFR calc Af Amer >60 >60 mL/min   Anion gap 12 5 - 15    Patient Active Problem List   Diagnosis Date Noted  . Encounter for induction of labor 11/28/2019  . Positive GBS test 11/19/2019  . Type 2 diabetes mellitus affecting pregnancy, antepartum 06/15/2019  . AMA (advanced maternal age) multigravida 35+ 06/10/2019  . Chronic hypertension affecting pregnancy 05/31/2019  . Supervision of other normal pregnancy, antepartum 05/17/2019  . Hx of pre-eclampsia in prior pregnancy, currently pregnant, unspecified trimester 05/17/2019  . History of C-section 05/17/2019  . Class 3 severe obesity due to excess calories without serious comorbidity with body mass index (BMI) of 45.0 to 49.9 in adult [facility]) 05/17/2019    Assessment: Sandra Mason is a 39 y.o. G2P1001 at [redacted]w[redacted]d here for chronic hypertension with superimposed severe preeclampsia, breech presentation.  # Chronic hypertension with superimposed preeclampsia w/ severe features: given significant recent rise in BP and new headache think severe and have ordered magnesium. Will give labetalol IV prn for severe-range BPs. Will admit and plan for delivery. Fetal status is reassuring. Denies being on anti-hypertensives prior to pregnancy. Labs significant only for proteinuria which is chronic.  # Breech presentation: Pt desires TOLAC. Lengthy discussion with patient about risks/benefits of ECV (abruption, cord prolapse, need for emergent cesarean, etc.), TOLAC (including risk of rupture), planned cesarean. Did share that given hx prior breech presentation, fibroid uterus, and obesity, chances of successful ECV are not high; pt wishes to give it a try. Will plan for epidural anesthesia and terbutaline prior. Plan for IOL if successful, section if not.  # Pre-gestational diabetes: on metformin prior to pregnancy, currently on levemir and lispro. Has not eaten today, glucose is 82. Will monitor.  # Jehova's witness - declines all blood products. H/H wnl. Will plan on TXA at delivery.  #Pain: epidural #FWB: Cat 1 #ID:  gbs pos, no indication for ppx at this time but will start if inducing #MOF: need to assess #MOC: BTL if c/s #Circ:  n/a  NDesma Maxim4/21/2021, 1:35 PM

## 2019-11-28 NOTE — Discharge Summary (Signed)
Postpartum Discharge Summary     Patient Name: Sandra Mason DOB: 1981-04-23 MRN: [id number]  Date of admission: 11/28/2019 Delivering Provider: Florian Buff   Date of discharge: 12/04/2019  Admitting diagnosis: Encounter for induction of labor [Z34.90] Severe preeclampsia [O14.10] Intrauterine pregnancy: [redacted]w[redacted]d    Secondary diagnosis:  Principal Problem:   Severe preeclampsia, delivered Active Problems:   Postpartum care following cesarean delivery   History of C-section   Class 3 severe obesity due to excess calories without serious comorbidity with body mass index (BMI) of 45.0 to 49.9 in adult ([facility]   Chronic hypertension with superimposed severe preeclampsia   AMA (advanced maternal age) multigravida 35+   Type 2 diabetes mellitus affecting pregnancy, antepartum   Breech presentation  Additional problems: None     Discharge diagnosis: Term Pregnancy Delivered, CHTN with superimposed preeclampsia and Type 2 DM                                                                                                Post partum procedures: None  Augmentation:  NA  Complications: None  Hospital course:  39y.o. yo G2P2002 at 360w3das admitted to the hospital 11/28/2019 for cHTN with superimposed Severe Pre-E. Patient with history of c-section for breech presentation and this infant also breech. Patient desired TOLAC and therefore ECV attempted and unsuccessful. Patient went for cesarean section with the following indication:Malpresentation and cHTN with superimposed severe Pre-E. Membrane Rupture Time/Date: 6:06 PM ,11/28/2019   Patient delivered a Viable infant.11/28/2019  Details of operation can be found in separate operative note. BTL not done due to body habitus and poor visualization.  Magnesium sulfate was continued for 24 hours post-partum. Metformin was initiated for glucose control which was stable. BP was difficult to control and her antihypertensive regimen was changed  several times.  Also involved the help of our Family Medicine colleagues.  Discussed patient with Dr. JaLoma BostonOB specialist and Hospitalist, [place] [facility] to continue her regimen today and she will follow up outpatient in two days for further evaluation. She was discharged on Hydralazine 25 mg po tid, Enalapril 20 mg bid, Procardia 60 mg bid, Chlorthalidone 25 mg po qd. Had stable PEC labs on 12/03/2019. She is ambulating, tolerating a regular diet, passing flatus, having bowel movements and urinating well. Patient is discharged home in stable condition on  12/04/19; will follow up on 12/06/19 [facility] and Prevena removal.        Delivery time: 6:09 PM   Magnesium Sulfate received: Yes BMZ received: No Rhophylac:No MMR:No Transfusion:No  Physical exam   12/04/19 0829 12/04/19 0958 12/04/19 1113  BP: (!) 168/72 (!) 153/74 (!) 148/67  Pulse: (!) 112 (!) 115 (!) 110  Resp: 17  18  Temp:   98.2 F (36.8 C)  TempSrc:   Oral  SpO2:   98%  Weight:     Height:      General: alert, cooperative and no distress Lochia: appropriate Uterine Fundus: firm Incision: Dressing is clean, dry, and intact;  Prevena in place DVT Evaluation: No evidence of DVT seen on physical exam. Negative Homan's sign. No cords or calf tenderness. Labs: Lab Results  Component Value Date   WBC 11.1 (H) 12/03/2019   HGB 10.9 (L) 12/03/2019   HCT 34.4 (L) 12/03/2019   MCV 93.7 12/03/2019   PLT 399 12/03/2019   CMP Latest Ref Rng & Units 12/03/2019  Glucose 70 - 99 mg/dL 148(H)  BUN 6 - 20 mg/dL 10  Creatinine 0.44 - 1.00 mg/dL 0.73  Sodium 135 - 145 mmol/L 138  Potassium 3.5 - 5.1 mmol/L 4.1  Chloride 98 - 111 mmol/L 101  CO2 22 - 32 mmol/L 22  Calcium 8.9 - 10.3 mg/dL 9.7  Total Protein 6.5 - 8.1 g/dL 7.0  Total Bilirubin 0.3 - 1.2 mg/dL 0.8  Alkaline Phos 38 - 126 U/L 75  AST 15 - 41 U/L 37  ALT 0 - 44 U/L 29    Edinburgh Score: Edinburgh Postnatal Depression Scale Screening Tool 12/02/2019  I have been able to laugh and see the funny side of things. 0  I have looked forward with enjoyment to things. 0  I have blamed myself unnecessarily when things went wrong. 2  I have been anxious or worried for no good reason. 0  I have felt scared or panicky for no good reason. 0  Things have been getting on top of me. 1  I have been so unhappy that I have had difficulty sleeping. 0  I have felt sad or miserable. 0  I have been so unhappy that I have been crying. 0  The thought of harming myself has occurred to me. 0  Edinburgh Postnatal Depression Scale Total 3    Discharge instruction: per After Visit Summary and "Baby and Me Booklet".  After visit meds:  Allergies as of 12/04/2019   No Known Allergies      Medication List     STOP taking these medications    insulin lispro 100 UNIT/ML injection Commonly known as: HumaLOG   labetalol 200 MG tablet Commonly known as: NORMODYNE       TAKE these medications    Accu-Chek FastClix Lancets Misc 1 Units by Percutaneous route 4 (four) times daily.   Accu-Chek Nano SmartView w/Device Kit 1 kit by Subdermal route as directed. Check blood sugars for fasting, and two hours after breakfast, lunch and dinner (4 checks daily)   aspirin EC 81 MG tablet Take 81 mg by mouth daily.   chlorthalidone 25 MG tablet Commonly known as: HYGROTON Take 1 tablet (25 mg total) by mouth daily. Start taking on: December 05, 2019   enalapril 20 MG tablet Commonly known as: VASOTEC Take 1 tablet (20 mg total) by mouth 2 (two) times daily.   glucose blood test strip DX: O24.419 Gestational diabetes check BS QID.   hydrALAZINE 25 MG tablet Commonly known as: APRESOLINE Take 1 tablet (25 mg total) by mouth 3 (three) times daily.   insulin detemir 100 UNIT/ML injection Commonly known as: Levemir 5 units q hs What changed: additional instructions    INSULIN SYRINGE .5CC/31GX5/16" 31G X 5/16" 0.5 ML Misc 1 Units by Does not apply route 5 (five) times daily.   metFORMIN 1000 MG tablet Commonly known as: GLUCOPHAGE Take 1 tablet (1,000 mg total) by mouth 2 (two) times daily with a meal.   NIFEdipine 60 MG 24 hr tablet Commonly known as: ADALAT CC Take 1 tablet (60 mg total) by mouth every 12 (twelve) hours.  oxyCODONE-acetaminophen 5-325 MG tablet Commonly known as: PERCOCET/ROXICET Take 1-2 tablets by mouth every 6 (six) hours as needed.   Prenatal Vitamins 28-0.8 MG Tabs Take 0.8 mg by mouth daily. What changed: Another medication with the same name was removed. Continue taking this medication, and follow the directions you see here.   vitamin C 1000 MG tablet Take 1,000 mg by mouth daily.               Discharge Care Instructions  (From admission, onward)           Start     Ordered   12/04/19 0000  Discharge wound care:    Comments: As per discharge handout and nursing instructions   12/04/19 1256            Diet: carb modified diet  Activity: Advance as tolerated. Pelvic rest for 6 weeks.   Future Appointments  Date Time Provider [place]  12/06/2019  8:30 AM Truett Mainland, DO [facility] None  12/12/2019  2:45 PM Lavonia Drafts, MD [facility] None  01/09/2020  1:30 PM Lavonia Drafts, MD [facility] None    Newborn Data: Live born female  Birth Weight: 2880g  APGAR: 5, 8  Newborn Delivery   Birth date/time: 11/28/2019 18:09:00 Delivery type: C-Section, Low Transverse Trial of labor: No C-section categorization: Repeat      Baby Feeding: Breast Disposition: in NICU  12/04/2019 Verita Schneiders, MD

## 2019-11-28 NOTE — MAU Note (Signed)
Had appt for NST/BPP this morning.  BP was up.  In on BP med, took it this morning.  Little HA, little bit of swelling in hands, denies visual changes or epigastric pain. Sandra Mason- not regular.  Is breech, for R c/s.Marland Kitchen

## 2019-11-28 NOTE — Anesthesia Procedure Notes (Signed)
Epidural Patient location during procedure: OB Start time: 11/28/2019 4:21 PM End time: 11/28/2019 4:29 PM  Staffing Anesthesiologist: Josephine Igo, MD Performed: anesthesiologist   Preanesthetic Checklist Completed: patient identified, IV checked, site marked, risks and benefits discussed, surgical consent, monitors and equipment checked, pre-op evaluation and timeout performed  Epidural Patient position: sitting Prep: DuraPrep and site prepped and draped Patient monitoring: continuous pulse ox and blood pressure Approach: midline Location: L3-L4 Injection technique: LOR air  Needle:  Needle type: Tuohy  Needle gauge: 17 G Needle length: 9 cm and 9 Needle insertion depth: 8 cm Catheter type: closed end flexible Catheter size: 19 Gauge Catheter at skin depth: 14 cm Test dose: negative and Other  Assessment Events: blood not aspirated, injection not painful, no injection resistance, no paresthesia and negative IV test  Additional Notes Patient identified. Risks and benefits discussed including failed block, incomplete  Pain control, post dural puncture headache, nerve damage, paralysis, blood pressure Changes, nausea, vomiting, reactions to medications-both toxic and allergic and post Partum back pain. All questions were answered. Patient expressed understanding and wished to proceed. Sterile technique was used throughout procedure. Epidural site was Dressed with sterile barrier dressing. No paresthesias, signs of intravascular injection Or signs of intrathecal spread were encountered.  Patient was more comfortable after the epidural was dosed. Please see RN's note for documentation of vital signs and FHR which are stable. Reason for block:at surgeon's request

## 2019-11-28 NOTE — Anesthesia Preprocedure Evaluation (Signed)
Anesthesia Evaluation  Patient identified by MRN, date of birth, ID band Patient awake    Reviewed: Allergy & Precautions, NPO status , Patient's Chart, lab work & pertinent test results, reviewed documented beta blocker date and time   Airway Mallampati: III  TM Distance: >3 FB Neck ROM: Full    Dental no notable dental hx. (+) Teeth Intact   Pulmonary neg pulmonary ROS,    Pulmonary exam normal breath sounds clear to auscultation       Cardiovascular hypertension, Pt. on medications and Pt. on home beta blockers Normal cardiovascular exam Rhythm:Regular Rate:Normal     Neuro/Psych negative neurological ROS  negative psych ROS   GI/Hepatic Neg liver ROS, GERD  ,  Endo/Other  diabetes, Well Controlled, Type 2, Insulin Dependent, Oral Hypoglycemic AgentsMorbid obesity  Renal/GU negative Renal ROS  negative genitourinary   Musculoskeletal negative musculoskeletal ROS (+)   Abdominal (+) + obese,   Peds  Hematology negative hematology ROS (+)   Anesthesia Other Findings   Reproductive/Obstetrics (+) Pregnancy Previous C/Section Pre eclampsia with severe features AMA Breech presentation                             Anesthesia Physical Anesthesia Plan  ASA: III  Anesthesia Plan: Epidural   Post-op Pain Management:    Induction:   PONV Risk Score and Plan: 4 or greater and Scopolamine patch - Pre-op, Ondansetron and Treatment may vary due to age or medical condition  Airway Management Planned: Natural Airway  Additional Equipment:   Intra-op Plan:   Post-operative Plan:   Informed Consent: I have reviewed the patients History and Physical, chart, labs and discussed the procedure including the risks, benefits and alternatives for the proposed anesthesia with the patient or authorized representative who has indicated his/her understanding and acceptance.     Dental advisory  given  Plan Discussed with: CRNA and Surgeon  Anesthesia Plan Comments: (Dr. Si Raider will attempt external cephalic version, if unsuccessful will proceed to C/Section. )        Anesthesia Quick Evaluation

## 2019-11-28 NOTE — Progress Notes (Signed)
External FHR monitors reapplied after ECV attempt; paper not running, but RN at bedside continuously. FHR 130's-150's. Paper began running at 1719.

## 2019-11-28 NOTE — Progress Notes (Signed)
   11/28/19 2213  Vital Signs  BP 115/61  BP Location Right Wrist  Patient Position (if appropriate) Supine  BP Method Automatic  Pulse Rate 85  Pulse Rate Source Monitor    15 minutes after Labetalol 80mg  IV.

## 2019-11-28 NOTE — Progress Notes (Signed)
   11/28/19 2151  Vital Signs  BP (!) 179/81  BP Location Right Arm  Patient Position (if appropriate) Supine  BP Method Automatic  Pulse Rate 82   Labetalol 80 mg given at 2157

## 2019-11-29 ENCOUNTER — Encounter: Payer: Self-pay | Admitting: *Deleted

## 2019-11-29 ENCOUNTER — Encounter: Payer: Medicaid Other | Admitting: Family Medicine

## 2019-11-29 LAB — CBC
HCT: 30.6 % — ABNORMAL LOW (ref 36.0–46.0)
Hemoglobin: 9.8 g/dL — ABNORMAL LOW (ref 12.0–15.0)
MCH: 30.1 pg (ref 26.0–34.0)
MCHC: 32 g/dL (ref 30.0–36.0)
MCV: 93.9 fL (ref 80.0–100.0)
Platelets: 296 10*3/uL (ref 150–400)
RBC: 3.26 MIL/uL — ABNORMAL LOW (ref 3.87–5.11)
RDW: 15.4 % (ref 11.5–15.5)
WBC: 13.1 10*3/uL — ABNORMAL HIGH (ref 4.0–10.5)
nRBC: 0 % (ref 0.0–0.2)

## 2019-11-29 LAB — GLUCOSE, CAPILLARY
Glucose-Capillary: 139 mg/dL — ABNORMAL HIGH (ref 70–99)
Glucose-Capillary: 140 mg/dL — ABNORMAL HIGH (ref 70–99)
Glucose-Capillary: 165 mg/dL — ABNORMAL HIGH (ref 70–99)
Glucose-Capillary: 166 mg/dL — ABNORMAL HIGH (ref 70–99)

## 2019-11-29 LAB — RPR: RPR Ser Ql: NONREACTIVE

## 2019-11-29 MED ORDER — ENALAPRIL MALEATE 5 MG PO TABS
10.0000 mg | ORAL_TABLET | Freq: Once | ORAL | Status: AC
  Administered 2019-11-29: 10 mg via ORAL

## 2019-11-29 NOTE — Progress Notes (Signed)
Labetalol 20mg  IV and Vasotec 10mg  po given as ordered at 2016.

## 2019-11-29 NOTE — Progress Notes (Signed)
   11/29/19 2034  Vital Signs  BP (!) 131/48  BP Location Left Wrist  Patient Position (if appropriate) Semi-fowlers  BP Method Automatic  Pulse Rate 93  Pulse Rate Source Monitor  Resp (!) 22   15 minutes after Labetalol 20mg  IV and Vasotec 10mg  po given

## 2019-11-29 NOTE — Progress Notes (Signed)
Patient screened out for psychosocial assessment since none of the following apply:  Psychosocial stressors documented in mother or baby's chart  Gestation less than 32 weeks  Code at delivery   Infant with anomalies Please contact the Clinical Social Worker if specific needs arise, by MOB's request, or if MOB scores greater than 9/yes to question 10 on Edinburgh Postpartum Depression Screen.  Linley Moxley, LCSW Clinical Social Worker [facility] Cell#: [phone]     

## 2019-11-29 NOTE — Progress Notes (Signed)
   11/29/19 1952  Vital Signs  BP (!) 188/62  BP Location Left Wrist  Patient Position (if appropriate) Semi-fowlers  BP Method Automatic  Pulse Rate 91  Pulse Rate Source Monitor  Resp 20   Hydralazine 10mg  IV given at 1953

## 2019-11-29 NOTE — Progress Notes (Signed)
   11/29/19 1932  Vital Signs  BP (!) 189/60  BP Location Left Wrist  Patient Position (if appropriate) Semi-fowlers  BP Method Automatic  Pulse Rate 92  Pulse Rate Source Monitor  Resp 20  Temp 98.8 F (37.1 C)  Temp Source Oral

## 2019-11-29 NOTE — Progress Notes (Signed)
   11/29/19 2007  Vital Signs  BP (!) 177/66  BP Location Left Wrist  Patient Position (if appropriate) Semi-fowlers  BP Method Automatic  Pulse Rate (!) 106  Pulse Rate Source Monitor  Resp 20    Dr Ilda Basset notified.  Orders received and carried.

## 2019-11-29 NOTE — Anesthesia Postprocedure Evaluation (Signed)
Anesthesia Post Note  Patient: Clinique Schappert  Procedure(s) Performed: CESAREAN SECTION WITH BILATERAL TUBAL LIGATION (N/A Abdomen)     Patient location during evaluation: PACU Anesthesia Type: Epidural Level of consciousness: oriented and awake and alert Pain management: pain level controlled Vital Signs Assessment: post-procedure vital signs reviewed and stable Respiratory status: spontaneous breathing, respiratory function stable and nonlabored ventilation Cardiovascular status: blood pressure returned to baseline and stable Postop Assessment: no headache, no backache, no apparent nausea or vomiting and spinal receding Anesthetic complications: no    Last Vitals:  Vitals:   11/29/19 1952 11/29/19 2007  BP: (!) 188/62 (!) 177/66  Pulse: 91 (!) 106  Resp: 20 20  Temp:    SpO2: 98%     Last Pain:  Vitals:   11/29/19 1932  TempSrc: Oral  PainSc: 5    Pain Goal: Patients Stated Pain Goal: 4 (11/29/19 1932)              Epidural/Spinal Function Cutaneous sensation: Normal sensation (11/29/19 1932), Patient able to flex knees: Yes (11/29/19 1932), Patient able to lift hips off bed: Yes (11/29/19 1932), Back pain beyond tenderness at insertion site: No (11/29/19 1932), Progressively worsening motor and/or sensory loss: No (11/29/19 1932), Bowel and/or bladder incontinence post epidural: No (11/29/19 1932)  Lidia Collum

## 2019-11-29 NOTE — Plan of Care (Signed)
  Problem: Education: Goal: Knowledge of General Education information will improve Description: Including pain rating scale, medication(s)/side effects and non-pharmacologic comfort measures Outcome: Completed/Met   Problem: Coping: Goal: Ability to identify and utilize available resources and services will improve Outcome: Completed/Met   Problem: Role Relationship: Goal: Ability to demonstrate positive interaction with newborn will improve Outcome: Completed/Met

## 2019-11-29 NOTE — Progress Notes (Signed)
Inpatient Diabetes Program Recommendations  AACE/ADA: New Consensus Statement on Inpatient Glycemic Control  Target Ranges:  Prepandial:   less than 140 mg/dL      Peak postprandial:   less than 180 mg/dL (1-2 hours)      Critically ill patients:  140 - 180 mg/dL   Results for SHAZIA, WEHRLY (MRN [id number]) as of 11/29/2019 07:23  Ref. Range 11/28/2019 15:23 11/28/2019 15:50 11/28/2019 19:28  Glucose-Capillary Latest Ref Range: 70 - 99 mg/dL 66 (L) 107 (H) 114 (H)  Results for ARNICE, HODUM (MRN [id number]) as of 11/29/2019 07:23  Ref. Range 05/17/2019 16:26  Hemoglobin A1C Latest Ref Range: 4.8 - 5.6 % 9.1 (H)   Review of Glycemic Control  Diabetes history: DM2 (dx in Oct 2020; had GDM on insulin during prior pregnancy) Outpatient Diabetes medications: Levemir 25 units QAM, Levemir 45 units QPM, Humalog 20 units with breakfast, Humalog 15 units with lunch and supper Current orders for Inpatient glycemic control: Metformin 1000 mg BID, Novolog 0-6 units TID with meals  NOTE: In reviewing chart noted patient had initial prenatal visit on 05/17/19, patient had GDM during prior pregnancy and used insulin during pregnancy. A1C was checked on 05/17/19 and resulted as 9.1% indicating an average glucose of 214 mg/dl. Per H&P, patient using Levemir and Humalog insulin during this pregnancy and patient had c-section on 11/28/19. Agree with current orders. Will follow glycemic trends while inpatient.  Recommend patient follow up with PCP regarding DM management.  Thanks, Barnie Alderman, RN, MSN, CDE Diabetes Coordinator Inpatient Diabetes Program [phone] (Team Pager from 8am to 5pm)

## 2019-11-29 NOTE — Lactation Note (Signed)
This note was copied from a baby's chart. Lactation Consultation Note  Patient Name: Sandra Mason [id number] Date: 11/29/2019 Reason for consult: Initial assessment;NICU baby;Early term 37-38.6wks  [id number] - 1157 -  I conducted an initial lactation consult with Sandra Mason. She was using her DEBP at the time of my visit. I noted that her flanges appeared snug, and I suggested that she size up to a 27. She was using the expression phase of her pump as well, and I showed her how to use the initiate program.  Sandra Mason has a personal electric pump at home. She also has Sandra Mason. I educated on the [facility] loaner pump option and our rental pump option should she wish to pursue that (her baby is in the NICU).  Sandra Mason has breast fed and pumped with previous children. She breast fed between 7 months and 1 year with her last child.  I reviewed the NICU booklet and provided a lactation resources brochure. I educated on cleaning and pumping hygiene as well as pumping frequency and expectations for her milk volume over the first 14 days. I also reviewed milk storage guidelines.  I provided a cleaning brush for her pump equipment.  PLAN: Pump 8 times a day including at night. Keep a log of pumping sessions. Pump when visiting baby Sandra Mason, if possible. Notify lactation if she wishes to pursue [facility] pump option.  Sandra Mason desires to breast feed Sandra Mason and to feed her breast milk to baby.  Interventions Interventions: Breast feeding basics reviewed;DEBP  Lactation Tools Discussed/Used Tools: Pump;Flanges Flange Size: 27 Breast pump type: Double-Electric Breast Pump WIC Program: Yes Pump Review: Setup, frequency, and cleaning;Milk Storage   Consult Status Consult Status: Follow-up Date: 11/30/19 Follow-up type: In-patient    Sandra Mason 11/29/2019, 12:51 PM

## 2019-11-29 NOTE — Plan of Care (Signed)
  Problem: Activity: Goal: Risk for activity intolerance will decrease Outcome: Completed/Met   Problem: Coping: Goal: Level of anxiety will decrease Outcome: Completed/Met   Problem: Elimination: Goal: Will not experience complications related to urinary retention Outcome: Completed/Met   Problem: Education: Goal: Knowledge of disease or condition will improve Outcome: Completed/Met

## 2019-11-30 ENCOUNTER — Other Ambulatory Visit (HOSPITAL_COMMUNITY): Payer: Medicaid Other

## 2019-11-30 LAB — GLUCOSE, CAPILLARY
Glucose-Capillary: 134 mg/dL — ABNORMAL HIGH (ref 70–99)
Glucose-Capillary: 115 mg/dL — ABNORMAL HIGH (ref 70–99)

## 2019-11-30 MED ORDER — INSULIN DETEMIR 100 UNIT/ML ~~LOC~~ SOLN
5.0000 [IU] | Freq: Every day | SUBCUTANEOUS | Status: DC
  Administered 2019-11-30 – 2019-12-04 (×5): 5 [IU] via SUBCUTANEOUS
  Filled 2019-11-30 (×6): qty 0.05

## 2019-11-30 MED ORDER — METFORMIN HCL 1000 MG PO TABS: 1000.0000 mg | ORAL_TABLET | Freq: Two times a day (BID) | ORAL | 2 refills | Status: AC

## 2019-11-30 MED ORDER — OXYCODONE-ACETAMINOPHEN 5-325 MG PO TABS: 1.0000 | ORAL_TABLET | Freq: Four times a day (QID) | ORAL | 0 refills | Status: DC | PRN

## 2019-11-30 MED ORDER — METFORMIN HCL 1000 MG PO TABS: 1000.0000 mg | ORAL_TABLET | Freq: Two times a day (BID) | ORAL | 2 refills | Status: DC

## 2019-11-30 MED ORDER — ENALAPRIL MALEATE 20 MG PO TABS: 20.0000 mg | ORAL_TABLET | Freq: Every day | ORAL | 1 refills | Status: DC

## 2019-11-30 MED ORDER — ENALAPRIL MALEATE 5 MG PO TABS
20.0000 mg | ORAL_TABLET | Freq: Every day | ORAL | Status: DC
  Administered 2019-11-30 – 2019-12-01 (×2): 20 mg via ORAL
  Filled 2019-11-30 (×2): qty 4

## 2019-11-30 MED ORDER — INSULIN DETEMIR 100 UNIT/ML ~~LOC~~ SOLN: SUBCUTANEOUS | 11 refills | Status: AC

## 2019-11-30 MED ORDER — INSULIN DETEMIR 100 UNIT/ML ~~LOC~~ SOLN: SUBCUTANEOUS | 11 refills | Status: DC

## 2019-11-30 MED FILL — ENALAPRIL MALEATE 20 MG TAB: 20 | 30 days supply | Qty: 30 | Fill #0

## 2019-11-30 MED FILL — metFORMIN HCL 1000 MG TABS: 1000 | 30 days supply | Qty: 60 | Fill #0

## 2019-11-30 MED FILL — LEVEMIR 100 UNITS/ML VIAL: 100 | 28 days supply | Qty: 10 | Fill #0

## 2019-11-30 MED FILL — OXYCODONE-APAP 5-325MG: 5-325 | 4 days supply | Qty: 20 | Fill #0

## 2019-11-30 NOTE — Progress Notes (Signed)
Postpartum Day 2: Cesarean Delivery at [redacted]w[redacted]d for breech, CHTN with superimposed severe preeeclampsia  Subjective: Patient denies any headaches, visual symptoms, RUQ/epigastric pain or other concerning symptoms. Patient reports tolerating PO, + flatus and no problems voiding.  Baby doing well in NICU.  Objective: Vital signs in last 24 hours: Temp:  [97.5 F (36.4 C)-98.9 F (37.2 C)] 97.5 F (36.4 C) (04/23 0741) Pulse Rate:  [81-106] 81 (04/23 0741) Resp:  [18-24] 22 (04/23 0741) BP: (127-191)/(48-69) 154/54 (04/23 0741) SpO2:  [98 %-100 %] 99 % (04/23 0741)  Patient Vitals for the past 24 hrs:  BP Temp Temp src Pulse Resp SpO2  11/30/19 0741 (!) 154/54 (!) 97.5 F (36.4 C) Oral 81 (!) 22 99 %  11/30/19 0404 (!) 147/69 98.3 F (36.8 C) Oral 92 (!) 24 100 %  11/30/19 0014 (!) 140/52 98.9 F (37.2 C) Oral (!) 103 (!) 22 100 %  11/29/19 2034 (!) 131/48 - - 93 (!) 22 98 %  11/29/19 2007 (!) 177/66 - - (!) 106 20 98 %  11/29/19 1952 (!) 188/62 - - 91 20 98 %  11/29/19 1947 (!) 191/63 - - 89 20 -  11/29/19 1932 (!) 189/60 98.8 F (37.1 C) Oral 92 20 98 %  11/29/19 1800 - - - - 20 -  11/29/19 1700 - - - - 19 -  11/29/19 1637 (!) 127/48 98.3 F (36.8 C) Oral 88 18 98 %  11/29/19 1600 - - - - 18 -  11/29/19 1500 - - - - 20 -  11/29/19 1400 - - - - 20 -  11/29/19 1300 - - - - 19 -  11/29/19 1200 - - - - 20 -    Physical Exam:  General: alert and no distress Lochia: appropriate Uterine Fundus: firm, obese abdomen Incision: Prevena in place DVT Evaluation: No evidence of DVT seen on physical exam.  Recent Labs    11/28/19 2020 11/29/19 0549  HGB 13.2 9.8*  HCT 41.3 30.6*   CMP Latest Ref Rng & Units 11/28/2019 05/31/2019 05/17/2019  Glucose 70 - 99 mg/dL 82 237(H) 197(H)  BUN 6 - 20 mg/dL 11 10 12   Creatinine 0.44 - 1.00 mg/dL 0.69 0.63 0.59  Sodium 135 - 145 mmol/L 135 135 132(L)  Potassium 3.5 - 5.1 mmol/L 4.1 4.2 3.7  Chloride 98 - 111 mmol/L 102 99 97  CO2 22 - 32  mmol/L 21(L) 19(L) 14(L)  Calcium 8.9 - 10.3 mg/dL 9.2 9.7 9.5  Total Protein 6.5 - 8.1 g/dL 6.6 6.5 6.4  Total Bilirubin 0.3 - 1.2 mg/dL 0.4 0.2 <0.2  Alkaline Phos 38 - 126 U/L 65 74 84  AST 15 - 41 U/L 22 27 27   ALT 0 - 44 U/L 15 28 31    CBC Latest Ref Rng & Units 11/29/2019 11/28/2019 11/28/2019  WBC 4.0 - 10.5 K/uL 13.1(H) 14.0(H) 9.5  Hemoglobin 12.0 - 15.0 g/dL 9.8(L) 13.2 12.5  Hematocrit 36.0 - 46.0 % 30.6(L) 41.3 38.5  Platelets 150 - 400 K/uL 296 293 313     Assessment/Plan: Status post Cesarean section.  Enalapril increased to 20 mg po qd, continue to monitor BP Levemir added for DM control as per DM Coordinator recommendations, appreciate their input.  Continue current care. Discharge home tomorrow if BP stable and no other concerns.  Already has [facility] deliver her meds with Enalapril 20 mg daily (if more BP control needed, may need to prescribe).  Verita Schneiders, MD 11/30/2019, 11:48 AM

## 2019-11-30 NOTE — Progress Notes (Signed)
Inpatient Diabetes Program Recommendations  AACE/ADA: New Consensus Statement on Inpatient Glycemic Control   Target Ranges:  Prepandial:   less than 140 mg/dL      Peak postprandial:   less than 180 mg/dL (1-2 hours)      Critically ill patients:  140 - 180 mg/dL   Results for AERIAL, PLUNK (MRN [id number]) as of 11/30/2019 07:11  Ref. Range 11/29/2019 08:01 11/29/2019 13:48 11/29/2019 18:47 11/29/2019 22:06  Glucose-Capillary Latest Ref Range: 70 - 99 mg/dL 139 (H) 140 (H) 165 (H) 166 (H)  Results for Sandra Mason, Sandra Mason (MRN [id number]) as of 11/30/2019 07:11  Ref. Range 05/17/2019 16:26  Hemoglobin A1C Latest Ref Range: 4.8 - 5.6 % 9.1 (H)   Review of Glycemic Control  Diabetes history: DM2 (dx in Oct 2020; had GDM on insulin during prior pregnancy) Outpatient Diabetes medications: Levemir 25 units QAM, Levemir 45 units QPM, Humalog 20 units with breakfast, Humalog 15 units with lunch and supper Current orders for Inpatient glycemic control: Metformin 1000 mg BID, Novolog 0-6 units TID with meals  Recommendations:  May want to consider ordering Levemir 5 units daily to improve glycemic control. Anticipate patient will need more than Metformin for outpatient DM control. Recommend patient follow up with PCP within 1 week regarding DM management.  NOTE: In reviewing chart noted patient had initial prenatal visit on 05/17/19, patient had GDM during prior pregnancy and used insulin during pregnancy. A1C was checked on 05/17/19 and resulted as 9.1% indicating an average glucose of 214 mg/dl. Per H&P, patient using Levemir and Humalog insulin during this pregnancy and patient had c-section on 11/28/19. Glucose ranged from 139-166 mg/dl on 11/29/19 with Metformin 1000 mg BID and Novolog correction scale.  Anticipate patient will require more than Metformin to maintain glycemic control.  Thanks, Barnie Alderman, RN, MSN, CDE Diabetes Coordinator Inpatient Diabetes Program [phone] (Team Pager from 8am  to 5pm)

## 2019-11-30 NOTE — Lactation Note (Signed)
This note was copied from a baby's chart. Lactation Consultation Note  Patient Name: Sandra Mason [id number] Date: 11/30/2019 Reason for consult: Follow-up assessment;NICU baby   Mom awake and eating when LC entered room.  She has pumped three times today.  She is comfortable with hand expression and denies any pain or discomfort with pumping.    LC encouraged her to pump at least 8 times in 24 hours.  Goal of pumping every 2-3 hours during the day with a longer stretch, 4 hours, at night.   LC also encouraged mom to hand express after pumping in order to collect more colostrum.    Mom feels heavier in her breast than she did yesterday.  LC encouraged her to call out for any assistance needed with pumping or concerns or questions regarding providing EBM  for her infant in NICU.   Maternal Data    Feeding Feeding Type: Formula Nipple Type: Nfant Slow Flow (purple)  LATCH Score                   Interventions    Lactation Tools Discussed/Used Pump Review: Setup, frequency, and cleaning(frequency reviewed with mom)   Consult Status Consult Status: Follow-up Date: 12/01/19 Follow-up type: In-patient    Ferne Coe [facility] 11/30/2019, 5:37 PM

## 2019-11-30 NOTE — Progress Notes (Addendum)
Late entry(I saw pt at this time but neglected to do the note)  Subjective: Postpartum Day 1: Cesarean Delivery Patient reports incisional pain and tolerating PO.    Objective: Vital signs in last 24 hours: Temp:  [97.5 F (36.4 C)-98.9 F (37.2 C)] 97.5 F (36.4 C) (04/23 0741) Pulse Rate:  [81-106] 81 (04/23 0741) Resp:  [18-24] 22 (04/23 0741) BP: (127-191)/(48-69) 154/54 (04/23 0741) SpO2:  [98 %-100 %] 99 % (04/23 0741)  Physical Exam:  General: alert, cooperative and no distress Lochia: appropriate Uterine Fundus: firm Incision: dressing is dry, Provena is in place DVT Evaluation: No evidence of DVT seen on physical exam.  Recent Labs    11/28/19 2020 11/29/19 0549  HGB 13.2 9.8*  HCT 41.3 30.6*    Assessment/Plan: Status post Cesarean section. Doing well postoperatively.  Continue current care Magnesium for 24 hours BP elevated on magnesium, enalapril 10 mg daily begun Metformin 1000 BID for glucose management .  Florian Buff

## 2019-12-01 LAB — GLUCOSE, CAPILLARY
Glucose-Capillary: 102 mg/dL — ABNORMAL HIGH (ref 70–99)
Glucose-Capillary: 100 mg/dL — ABNORMAL HIGH (ref 70–99)
Glucose-Capillary: 115 mg/dL — ABNORMAL HIGH (ref 70–99)
Glucose-Capillary: 131 mg/dL — ABNORMAL HIGH (ref 70–99)

## 2019-12-01 MED ORDER — NIFEDIPINE ER OSMOTIC RELEASE 30 MG PO TB24
30.0000 mg | ORAL_TABLET | Freq: Every day | ORAL | Status: DC
  Administered 2019-12-01: 30 mg via ORAL
  Filled 2019-12-01: qty 1

## 2019-12-01 MED ORDER — NIFEDIPINE ER OSMOTIC RELEASE 30 MG PO TB24: 60.0000 mg | ORAL_TABLET | Freq: Every day | ORAL | Status: DC

## 2019-12-01 MED ORDER — NIFEDIPINE ER OSMOTIC RELEASE 30 MG PO TB24
30.0000 mg | ORAL_TABLET | ORAL | Status: AC
  Administered 2019-12-01: 30 mg via ORAL
  Filled 2019-12-01: qty 1

## 2019-12-01 MED ORDER — NIFEDIPINE ER 30 MG PO TB24: 30.0000 mg | ORAL_TABLET | Freq: Every day | ORAL | 1 refills | Status: DC

## 2019-12-01 NOTE — Lactation Note (Signed)
This note was copied from a baby's chart. Lactation Consultation Note  Patient Name: Girl Raymona Broach [id number] Date: 12/01/2019  Randel Books is 62 hours in the NICU.  Mom is currently pumping and obtaining a small amount of transitional milk.  She is pumping every 3 hours.  Encouraged to put baby to breast as much as NICU allows.  Mom states she latched baby yesterday.  Instructed to call for assist prn.   Maternal Data    Feeding Feeding Type: Formula Nipple Type: Nfant Slow Flow (purple)  LATCH Score                   Interventions    Lactation Tools Discussed/Used     Consult Status      Ave Filter 12/01/2019, 8:49 AM

## 2019-12-01 NOTE — Progress Notes (Signed)
Subjective: Postpartum Day 3: Cesarean Delivery Patient reports doing well no complaints Minimal pain med usage.    Objective: Vital signs in last 24 hours: Temp:  [97.5 F (36.4 C)-99.1 F (37.3 C)] 98.6 F (37 C) (04/24 0612) Pulse Rate:  [81-104] 91 (04/24 0646) Resp:  [19-22] 20 (04/24 0612) BP: (146-175)/(51-73) 146/62 (04/24 0646) SpO2:  [98 %-100 %] 100 % (04/23 2336)  Physical Exam:  General: alert, cooperative and no distress Lochia: appropriate Uterine Fundus: firm Incision: healing well, provena in place DVT Evaluation: No evidence of DVT seen on physical exam.  Recent Labs    11/28/19 2020 11/29/19 0549  HGB 13.2 9.8*  HCT 41.3 30.6*    Assessment/Plan: Status post Cesarean section. Doing well postoperatively.   >Type 2 diabetes(sure her diabetes pre dated pregnancy with A1C 9 in the first trimester) Pretty decent control just with the metformin minimal levimir sliding used, should improve more as HPL levels wane  >Severe pre eclampsia:  Still suboptima BP control On vasotec 20 mg daily had to get a dose of IV labetalol for elevated severe range pressure I added procardia xl 30 this am  Evaluate for discharge tomorrow looking for improved BP control.  Florian Buff 12/01/2019, 7:13 AM

## 2019-12-02 ENCOUNTER — Inpatient Hospital Stay (HOSPITAL_COMMUNITY)
Admission: AD | Admit: 2019-12-02 | Payer: Medicaid Other | Source: Home / Self Care | Admitting: Obstetrics and Gynecology

## 2019-12-02 ENCOUNTER — Inpatient Hospital Stay (HOSPITAL_COMMUNITY): Payer: Medicaid Other

## 2019-12-02 LAB — GLUCOSE, CAPILLARY
Glucose-Capillary: 139 mg/dL — ABNORMAL HIGH (ref 70–99)
Glucose-Capillary: 108 mg/dL — ABNORMAL HIGH (ref 70–99)
Glucose-Capillary: 111 mg/dL — ABNORMAL HIGH (ref 70–99)
Glucose-Capillary: 114 mg/dL — ABNORMAL HIGH (ref 70–99)

## 2019-12-02 MED ORDER — HYDRALAZINE HCL 20 MG/ML IJ SOLN
5.0000 mg | INTRAMUSCULAR | Status: DC | PRN
  Administered 2019-12-02: 5 mg via INTRAVENOUS

## 2019-12-02 MED ORDER — NIFEDIPINE ER OSMOTIC RELEASE 30 MG PO TB24
60.0000 mg | ORAL_TABLET | Freq: Two times a day (BID) | ORAL | Status: DC
  Administered 2019-12-02 – 2019-12-04 (×5): 60 mg via ORAL
  Filled 2019-12-02 (×5): qty 2

## 2019-12-02 MED ORDER — HYDRALAZINE HCL 20 MG/ML IJ SOLN
10.0000 mg | INTRAMUSCULAR | Status: DC | PRN
  Administered 2019-12-02: 10 mg via INTRAVENOUS

## 2019-12-02 MED ORDER — ENALAPRIL MALEATE 5 MG PO TABS
20.0000 mg | ORAL_TABLET | Freq: Two times a day (BID) | ORAL | Status: DC
  Administered 2019-12-02 – 2019-12-04 (×5): 20 mg via ORAL
  Filled 2019-12-02 (×4): qty 4
  Filled 2019-12-02: qty 1

## 2019-12-02 MED ORDER — LABETALOL HCL 5 MG/ML IV SOLN
40.0000 mg | INTRAVENOUS | Status: DC | PRN
  Administered 2019-12-03: 19:00:00 40 mg via INTRAVENOUS

## 2019-12-02 MED ORDER — LABETALOL HCL 5 MG/ML IV SOLN
20.0000 mg | INTRAVENOUS | Status: DC | PRN
  Administered 2019-12-02 – 2019-12-03 (×2): 20 mg via INTRAVENOUS

## 2019-12-02 NOTE — Lactation Note (Signed)
This note was copied from a baby's chart. Lactation Consultation Note  Patient Name: Sandra Mason [id number] Date: 12/02/2019 Reason for consult: Follow-up assessment;NICU baby;Maternal endocrine disorder Type of Endocrine Disorder?: Diabetes   RN requested lactation to see mom.  Mom has been pumping but has not pumped in 6 hours.  She states she usually is pumping every 2 hours and collected 50 mls.    Infant not cueing in NICU but placed STS with mom and mom hand expressed milk to nipple.  Infant attempted to latch but was unable to achieve latch due to short shaft and semi flat nipple.  Breast was full due to not pumping for 6 hours.  LC applied NS and used curved tip to fill prior to latching.  Infant opened and achieved latch after multiple attempts but with a narrow gape.  [place] worked to try to pull down on chin to widen gape.  When this occurred infant began sucking a few times then stopped.  Massage and compression used but infant continued to stop and take long pauses after sucking. Infant appeared lethargic during feeding.  Mom used football hold for positioning.  LC encouraged dad to come and observe feed so he would be able to help mom with pillow support, and head support when mom latched infant.    Infant actively feed for total of 8 minutes but only a few audible swallows heard.  No milk was seen in NS after feeding and nipple was everted into shield when infant came off the breast.  Mom was encouraged to pump every 2-3 hours not going longer than 4 hours at night to pump.  Mom is familiar with NS and how to apply correctly.  Shed used the NS with her previous daughter for a temporary period.   LC praised mom for her efforts and encouraged mom to call out when she desired LC assist with latch in NICU in the future.  She is aware that infant's RN call let an [place] know a time that works for mom.     Maternal Data Has patient been taught Hand Expression?: Yes Does the patient have  breastfeeding experience prior to this delivery?: Yes  Feeding Feeding Type: Breast Fed  LATCH Score Latch: Repeated attempts needed to sustain latch, nipple held in mouth throughout feeding, stimulation needed to elicit sucking reflex.  Audible Swallowing: A few with stimulation  Type of Nipple: Flat(semi-everted, short shaft, but full breast so difficulty with nipple everting)  Comfort (Breast/Nipple): Soft / non-tender  Hold (Positioning): Assistance needed to correctly position infant at breast and maintain latch.  LATCH Score: 6  Interventions Interventions: Breast feeding basics reviewed;Assisted with latch;Skin to skin;Breast massage;Hand express;Position options;Breast compression;Adjust position;Support pillows;DEBP;Expressed milk  Lactation Tools Discussed/Used Tools: Nipple Shields Nipple shield size: 24 Flange Size: 24 Breast pump type: Double-Electric Breast Pump   Consult Status Consult Status: Follow-up Date: 12/03/19 Follow-up type: In-patient    Ferne Coe [facility] 12/02/2019, 4:14 PM

## 2019-12-02 NOTE — Progress Notes (Signed)
Patient ID: Sandra Mason, female   DOB: 14-Jul-1981, 39 y.o.   MRN: [id number] Subjective: Postpartum Day 4: Cesarean Delivery Patient reports doing well no complaints. She is ambulating, tolerating a regular diet, voiding. Pain well controlled.     Objective: Vital signs in last 24 hours: Temp:  [98.1 F (36.7 C)-99.1 F (37.3 C)] 98.5 F (36.9 C) (04/25 0513) Pulse Rate:  [86-106] 95 (04/25 0642) Resp:  [18-19] 19 (04/25 0513) BP: (150-192)/(53-87) 152/78 (04/25 0642) SpO2:  [98 %-100 %] 98 % (04/25 0516)  Physical Exam:  General: alert, cooperative and no distress Lochia: appropriate Uterine Fundus: firm Incision: healing well, provena in place DVT Evaluation: No evidence of DVT seen on physical exam.  No results for input(s): HGB, HCT in the last 72 hours.  Assessment/Plan: Status post Cesarean section. Doing well postoperatively.   >Type 2 diabetes CBG improving. Will continue metformin and levimir sliding scale  >Severe pre eclampsia:  Still suboptima BP control- Patient with severe range BP overnight Vasotec increased to 20 BID (from 20 daily) and procardia increased to 60 BID (from 60 daily)  Continue current postpartum care Discharge planning pending improvement in BP  Lulu Hirschmann 12/02/2019, 7:39 AM

## 2019-12-03 LAB — COMPREHENSIVE METABOLIC PANEL
ALT: 29 U/L (ref 0–44)
AST: 37 U/L (ref 15–41)
Albumin: 3 g/dL — ABNORMAL LOW (ref 3.5–5.0)
Alkaline Phosphatase: 75 U/L (ref 38–126)
Anion gap: 15 (ref 5–15)
BUN: 10 mg/dL (ref 6–20)
CO2: 22 mmol/L (ref 22–32)
Calcium: 9.7 mg/dL (ref 8.9–10.3)
Chloride: 101 mmol/L (ref 98–111)
Creatinine, Ser: 0.73 mg/dL (ref 0.44–1.00)
GFR calc Af Amer: >60 mL/min (ref >60)
GFR calc non Af Amer: >60 mL/min (ref >60)
Glucose, Bld: 148 mg/dL — ABNORMAL HIGH (ref 70–99)
Potassium: 4.1 mmol/L (ref 3.5–5.1)
Sodium: 138 mmol/L (ref 135–145)
Total Bilirubin: 0.8 mg/dL (ref 0.3–1.2)
Total Protein: 7 g/dL (ref 6.5–8.1)

## 2019-12-03 LAB — GLUCOSE, CAPILLARY
Glucose-Capillary: 124 mg/dL — ABNORMAL HIGH (ref 70–99)
Glucose-Capillary: 118 mg/dL — ABNORMAL HIGH (ref 70–99)
Glucose-Capillary: 138 mg/dL — ABNORMAL HIGH (ref 70–99)
Glucose-Capillary: 171 mg/dL — ABNORMAL HIGH (ref 70–99)

## 2019-12-03 LAB — CBC
HCT: 34.4 % — ABNORMAL LOW (ref 36.0–46.0)
Hemoglobin: 10.9 g/dL — ABNORMAL LOW (ref 12.0–15.0)
MCH: 29.7 pg (ref 26.0–34.0)
MCHC: 31.7 g/dL (ref 30.0–36.0)
MCV: 93.7 fL (ref 80.0–100.0)
Platelets: 399 10*3/uL (ref 150–400)
RBC: 3.67 MIL/uL — ABNORMAL LOW (ref 3.87–5.11)
RDW: 15.2 % (ref 11.5–15.5)
WBC: 11.1 10*3/uL — ABNORMAL HIGH (ref 4.0–10.5)
nRBC: 0.3 % — ABNORMAL HIGH (ref 0.0–0.2)

## 2019-12-03 LAB — SURGICAL PATHOLOGY

## 2019-12-03 MED ORDER — HYDRALAZINE HCL 50 MG PO TABS
25.0000 mg | ORAL_TABLET | Freq: Three times a day (TID) | ORAL | Status: DC
  Administered 2019-12-03 – 2019-12-04 (×3): 25 mg via ORAL
  Filled 2019-12-03 (×3): qty 1

## 2019-12-03 MED ORDER — CHLORTHALIDONE 25 MG PO TABS
25.0000 mg | ORAL_TABLET | Freq: Every day | ORAL | Status: DC
  Administered 2019-12-03 – 2019-12-04 (×2): 25 mg via ORAL
  Filled 2019-12-03 (×3): qty 1

## 2019-12-03 NOTE — Lactation Note (Signed)
This note was copied from a baby's chart. Lactation Consultation Note  Patient Name: Sandra Mason [id number] Date: 12/03/2019 Reason for consult: Follow-up assessment;NICU baby;Maternal endocrine disorder Type of Endocrine Disorder?: PCOS  Visited with mom of a 68 day old ETI NICU female, baby is now getting both breast and formula feedings through NG tube. Mom has also been taking baby to breast with NS # 24 and she told LC she's been using it on every single feeding, "it helps, because baby is also getting bottles" mom said.   Mom told LC that she hasn't taken baby to breast today but she did yesterday and she latched on "just fine" with NS # 24. Asked mom to call for assistance when needed whether she's still at the hospital or even after her discharge if baby stays as a baby patient. Mom voiced that she has noticed baby's colostrum on NS yesterday after baby was done with her feedings; she took her to breast 3 times, she's BF before and is experienced, this is her second child.  She told LC her nipples are becoming a bit sensitive, LC called [facility] front desk to ask for some coconut oil, advised mom to use it prior pumping to add some lubrication until she has more colostrum. She's now pumping every 2.5 hours and getting about 70 ml per pumping session, praised her for her efforts.  Mom will continue pumping at least 8 times/24 hours, and she's aware of breast massage techniques in order to fully empty to breast. She also understands that sometimes pumping may be done sooner than later if breasts are getting too full too soon. She reported all questions and concerns were answered, she's aware of [facility] OP services and will call PRN.   Maternal Data    Feeding    LATCH Score                   Interventions Interventions: Breast feeding basics reviewed;Coconut oil;DEBP  Lactation Tools Discussed/Used Tools: Coconut oil;Nipple [place] Fuel;Pump Flange Size: 24 Breast  pump type: Double-Electric Breast Pump   Consult Status Consult Status: Follow-up Date: 12/04/19 Follow-up type: In-patient    [name] Francene Boyers 12/03/2019, 4:30 PM

## 2019-12-03 NOTE — Progress Notes (Signed)
During the night, patient's bp cuff was readjusted at different times for an accurate bp reading. BP cuff would begin to slip down or pop off during readings. BP checked on wrist instead of forearm. Dr. Rip Harbour notified of high bp's and rechecks WNL

## 2019-12-03 NOTE — Progress Notes (Signed)
Postpartum Day 5: Cesarean Delivery  Subjective: Patient reports doing well no complaints. She is ambulating, tolerating a regular diet, voiding. Pain well controlled. Patient denies any headaches, visual symptoms, RUQ/epigastric pain or other concerning symptoms. Baby doing well in NICU as per patient.   Objective: Vital signs in last 24 hours: Temp:  [98.1 F (36.7 C)-98.9 F (37.2 C)] 98.1 F (36.7 C) (04/26 0757) Pulse Rate:  [96-112] 111 (04/26 0757) Resp:  [18-20] 20 (04/26 0757) BP: (150-173)/(65-95) 157/83 (04/26 0757) SpO2:  [98 %-100 %] 100 % (04/26 0757)  Patient Vitals for the past 24 hrs:  BP Temp Temp src Pulse Resp SpO2  12/03/19 0757 (!) 157/83 98.1 F (36.7 C) Oral (!) 111 20 100 %  12/03/19 0756 -- -- -- -- -- 98 %  12/03/19 0628 (!) 158/79 -- -- (!) 104 -- 100 %  12/03/19 0324 (!) 155/65 -- -- (!) 106 -- --  12/03/19 0321 (!) 169/81 -- -- (!) 107 -- --  12/03/19 0306 (!) 162/70 98.6 F (37 C) Oral (!) 109 18 98 %  12/03/19 0301 (!) 161/86 -- -- (!) 108 -- --  12/02/19 2210 (!) 150/78 -- -- 97 -- --  12/02/19 2209 (!) 160/87 -- -- (!) 101 -- --  12/02/19 2208 (!) 156/95 -- -- 97 -- --  12/02/19 2150 (!) 151/69 -- -- 96 -- --  12/02/19 2133 (!) 173/66 -- -- 100 -- --  12/02/19 2050 (!) 173/70 -- -- -- -- --  12/02/19 2046 (!) 163/76 98.2 F (36.8 C) Oral (!) 101 18 100 %  12/02/19 1813 (!) 159/69 98.2 F (36.8 C) Oral (!) 112 18 100 %  12/02/19 1401 (!) 160/72 98.9 F (37.2 C) -- 97 -- 100 %  12/02/19 1007 (!) 154/65 -- -- 100 -- --   Physical Exam:  General: alert, cooperative and no distress Lochia: appropriate Uterine Fundus: firm Incision: healing well, Prevena in place DVT Evaluation: No evidence of DVT seen on physical exam.  CBG (last 3)  Recent Labs    12/02/19 1828 12/02/19 2206 12/03/19 0917  GLUCAP 111* 114* 124*   Assessment/Plan: Status post Cesarean section. Postpartum course complicated by difficult to control BP.  >CHTN with  superimposed severe preeclampsia:  Still suboptimal BP control- Patient still with severe range BP overnight.  Already on Enalapril 20 mg bid and Procardia 60 mg bid. Curbsided Dr. [facility]), she recommended adding on a diuretic with a longer half life.  Chlorthalidone 25 mg po qd added. Will also recheck [facility] labs today.  >Type 2 diabetes CBG improving. Will continue metformin and levimir sliding scale  Continue current postpartum care.  Discharge planning pending improvement in BP.  Verita Schneiders, MD 12/03/2019, 9:24 AM

## 2019-12-04 LAB — GLUCOSE, CAPILLARY: Glucose-Capillary: 138 mg/dL — ABNORMAL HIGH (ref 70–99)

## 2019-12-04 MED ORDER — NIFEDIPINE ER 60 MG PO TB24: 60.0000 mg | ORAL_TABLET | Freq: Two times a day (BID) | ORAL | 2 refills | Status: DC

## 2019-12-04 MED ORDER — ENALAPRIL MALEATE 20 MG PO TABS: 20.0000 mg | ORAL_TABLET | Freq: Two times a day (BID) | ORAL | 2 refills | Status: DC

## 2019-12-04 MED ORDER — HYDRALAZINE HCL 25 MG PO TABS: 25.0000 mg | ORAL_TABLET | Freq: Three times a day (TID) | ORAL | 2 refills | Status: DC

## 2019-12-04 MED ORDER — CHLORTHALIDONE 25 MG PO TABS: 25.0000 mg | ORAL_TABLET | Freq: Every day | ORAL | 2 refills | Status: DC

## 2019-12-04 MED FILL — ENALAPRIL MALEATE 20 MG TAB: 20 | 30 days supply | Qty: 60 | Fill #0

## 2019-12-04 MED FILL — NIFEdipine ER 60 MG TB24: 60 | 30 days supply | Qty: 60 | Fill #0

## 2019-12-04 MED FILL — hydrALAZINE HCL 25 MG TABS: 25 | 30 days supply | Qty: 90 | Fill #0

## 2019-12-04 MED FILL — CHLORTHALIDONE 25 MG TAB: 25 | 30 days supply | Qty: 30 | Fill #0

## 2019-12-04 NOTE — Discharge Instructions (Signed)
Cesarean Delivery, Care After This sheet gives you information about how to care for yourself after your procedure. Your health care provider may also give you more specific instructions. If you have problems or questions, contact your health care provider. What can I expect after the procedure? After the procedure, it is common to have:  A small amount of blood or clear fluid coming from the incision.  Some redness, swelling, and pain in your incision area.  Some abdominal pain and soreness.  Vaginal bleeding (lochia). Even though you did not have a vaginal delivery, you will still have vaginal bleeding and discharge.  Pelvic cramps.  Fatigue. You may have pain, swelling, and discomfort in the tissue between your vagina and your anus (perineum) if:  Your C-section was unplanned, and you were allowed to labor and push.  An incision was made in the area (episiotomy) or the tissue tore during attempted vaginal delivery. Follow these instructions at home: Incision care   Follow instructions from your health care provider about how to take care of your incision. Make sure you: ? Wash your hands with soap and water before you change your bandage (dressing). If soap and water are not available, use hand sanitizer. ? If you have a dressing, change it or remove it as told by your health care provider. ? Leave stitches (sutures), skin staples, skin glue, or adhesive strips in place. These skin closures may need to stay in place for 2 weeks or longer. If adhesive strip edges start to loosen and curl up, you may trim the loose edges. Do not remove adhesive strips completely unless your health care provider tells you to do that.  Check your incision area every day for signs of infection. Check for: ? More redness, swelling, or pain. ? More fluid or blood. ? Warmth. ? Pus or a bad smell.  Do not take baths, swim, or use a hot tub until your health care provider says it's okay. Ask your health  care provider if you can take showers.  When you cough or sneeze, hug a pillow. This helps with pain and decreases the chance of your incision opening up (dehiscing). Do this until your incision heals. Medicines  Take over-the-counter and prescription medicines only as told by your health care provider.  If you were prescribed an antibiotic medicine, take it as told by your health care provider. Do not stop taking the antibiotic even if you start to feel better.  Do not drive or use heavy machinery while taking prescription pain medicine. Lifestyle  Do not drink alcohol. This is especially important if you are breastfeeding or taking pain medicine.  Do not use any products that contain nicotine or tobacco, such as cigarettes, e-cigarettes, and chewing tobacco. If you need help quitting, ask your health care provider. Eating and drinking  Drink at least 8 eight-ounce glasses of water every day unless told not to by your health care provider. If you breastfeed, you may need to drink even more water.  Eat high-fiber foods every day. These foods may help prevent or relieve constipation. High-fiber foods include: ? Whole grain cereals and breads. ? Brown rice. ? Beans. ? Fresh fruits and vegetables. Activity   If possible, have someone help you care for your baby and help with household activities for at least a few days after you leave the hospital.  Return to your normal activities as told by your health care provider. Ask your health care provider what activities are safe for   you.  Rest as much as possible. Try to rest or take a nap while your baby is sleeping.  Do not lift anything that is heavier than 10 lbs (4.5 kg), or the limit that you were told, until your health care provider says that it is safe.  Talk with your health care provider about when you can engage in sexual activity. This may depend on your: ? Risk of infection. ? How fast you heal. ? Comfort and desire to  engage in sexual activity. General instructions  Do not use tampons or douches until your health care provider approves.  Wear loose, comfortable clothing and a supportive and well-fitting bra.  Keep your perineum clean and dry. Wipe from front to back when you use the toilet.  If you pass a blood clot, save it and call your health care provider to discuss. Do not flush blood clots down the toilet before you get instructions from your health care provider.  Keep all follow-up visits for you and your baby as told by your health care provider. This is important. Contact a health care provider if:  You have: ? A fever. ? Bad-smelling vaginal discharge. ? Pus or a bad smell coming from your incision. ? Difficulty or pain when urinating. ? A sudden increase or decrease in the frequency of your bowel movements. ? More redness, swelling, or pain around your incision. ? More fluid or blood coming from your incision. ? A rash. ? Nausea. ? Little or no interest in activities you used to enjoy. ? Questions about caring for yourself or your baby.  Your incision feels warm to the touch.  Your breasts turn red or become painful or hard.  You feel unusually sad or worried.  You vomit.  You pass a blood clot from your vagina.  You urinate more than usual.  You are dizzy or light-headed. Get help right away if:  You have: ? Pain that does not go away or get better with medicine. ? Chest pain. ? Difficulty breathing. ? Blurred vision or spots in your vision. ? Thoughts about hurting yourself or your baby. ? New pain in your abdomen or in one of your legs. ? A severe headache.  You faint.  You bleed from your vagina so much that you fill more than one sanitary pad in one hour. Bleeding should not be heavier than your heaviest period. Summary  After the procedure, it is common to have pain at your incision site, abdominal cramping, and slight bleeding from your vagina.  Check  your incision area every day for signs of infection.  Tell your health care provider about any unusual symptoms.  Keep all follow-up visits for you and your baby as told by your health care provider. This information is not intended to replace advice given to you by your health care provider. Make sure you discuss any questions you have with your health care provider. Document Revised: 02/01/2018 Document Reviewed: 02/01/2018 Elsevier Patient Education  [place].    Postpartum Hypertension Postpartum hypertension is high blood pressure that remains higher than normal after childbirth. You may not realize that you have postpartum hypertension if your blood pressure is not being checked regularly. In most cases, postpartum hypertension will go away on its own, usually within a week of delivery. However, for some women, medical treatment is required to prevent serious complications, such as seizures or stroke. What are the causes? This condition may be caused by one or more of the  following:  Hypertension that existed before pregnancy (chronic hypertension).  Hypertension that comes on as a result of pregnancy (gestational hypertension).  Hypertensive disorders during pregnancy (preeclampsia) or seizures in women who have high blood pressure during pregnancy (eclampsia).  A condition in which the liver, platelets, and red blood cells are damaged during pregnancy (HELLP syndrome).  A condition in which the thyroid produces too much hormones (hyperthyroidism).  Other rare problems of the nerves (neurological disorders) or blood disorders. In some cases, the cause may not be known. What increases the risk? The following factors may make you more likely to develop this condition:  Chronic hypertension. In some cases, this may not have been diagnosed before pregnancy.  Obesity.  Type 2 diabetes.  Kidney disease.  History of preeclampsia or eclampsia.  Other medical  conditions that change the level of hormones in the body (hormonal imbalance). What are the signs or symptoms? As with all types of hypertension, postpartum hypertension may not have any symptoms. Depending on how high your blood pressure is, you may experience:  Headaches. These may be mild, moderate, or severe. They may also be steady, constant, or sudden in onset (thunderclap headache).  Changes in your ability to see (visual changes).  Dizziness.  Shortness of breath.  Swelling of your hands, feet, lower legs, or face. In some cases, you may have swelling in more than one of these locations.  Heart palpitations or a racing heartbeat.  Difficulty breathing while lying down.  Decrease in the amount of urine that you pass. Other rare signs and symptoms may include:  Sweating more than usual. This lasts longer than a few days after delivery.  Chest pain.  Sudden dizziness when you get up from sitting or lying down.  Seizures.  Nausea or vomiting.  Abdominal pain. How is this diagnosed? This condition may be diagnosed based on the results of a physical exam, blood pressure measurements, and blood and urine tests. You may also have other tests, such as a CT scan or an MRI, to check for other problems of postpartum hypertension. How is this treated? If blood pressure is high enough to require treatment, your options may include:  Medicines to reduce blood pressure (antihypertensives). Tell your health care provider if you are breastfeeding or if you plan to breastfeed. There are many antihypertensive medicines that are safe to take while breastfeeding.  Stopping medicines that may be causing hypertension.  Treating medical conditions that are causing hypertension.  Treating the complications of hypertension, such as seizures, stroke, or kidney problems. Your health care provider will also continue to monitor your blood pressure closely until it is within a safe range for  you. Follow these instructions at home:  Take over-the-counter and prescription medicines only as told by your health care provider.  Return to your normal activities as told by your health care provider. Ask your health care provider what activities are safe for you.  Do not use any products that contain nicotine or tobacco, such as cigarettes and e-cigarettes. If you need help quitting, ask your health care provider.  Keep all follow-up visits as told by your health care provider. This is important. Contact a health care provider if:  Your symptoms get worse.  You have new symptoms, such as: ? A headache that does not get better. ? Dizziness. ? Visual changes. Get help right away if:  You suddenly develop swelling in your hands, ankles, or face.  You have sudden, rapid weight gain.  You develop  difficulty breathing, chest pain, racing heartbeat, or heart palpitations.  You develop severe pain in your abdomen.  You have any symptoms of a stroke. "BE FAST" is an easy way to remember the main warning signs of a stroke: ? B - Balance. Signs are dizziness, sudden trouble walking, or loss of balance. ? E - Eyes. Signs are trouble seeing or a sudden change in vision. ? F - Face. Signs are sudden weakness or numbness of the face, or the face or eyelid drooping on one side. ? A - Arms. Signs are weakness or numbness in an arm. This happens suddenly and usually on one side of the body. ? S - Speech. Signs are sudden trouble speaking, slurred speech, or trouble understanding what people say. ? T - Time. Time to call emergency services. Write down what time symptoms started.  You have other signs of a stroke, such as: ? A sudden, severe headache with no known cause. ? Nausea or vomiting. ? Seizure. These symptoms may represent a serious problem that is an emergency. Do not wait to see if the symptoms will go away. Get medical help right away. Call your local emergency services (911 in  the [place] Do not drive yourself to the hospital. Summary  Postpartum hypertension is high blood pressure that remains higher than normal after childbirth.  In most cases, postpartum hypertension will go away on its own, usually within a week of delivery.  For some women, medical treatment is required to prevent serious complications, such as seizures or stroke. This information is not intended to replace advice given to you by your health care provider. Make sure you discuss any questions you have with your health care provider. Document Revised: 09/01/2018 Document Reviewed: 05/16/2017 Elsevier Patient Education  2020 [employer].

## 2019-12-04 NOTE — Progress Notes (Signed)
Postpartum Day 6: Cesarean Delivery  Subjective: No complaints. She is ambulating, tolerating a regular diet, voiding, has flatus and had bowel movements. Pain well controlled.  Denies any headaches, visual symptoms, RUQ/epigastric pain or other concerning symptoms. Baby doing well in NICU as per patient.  Patient really wants to go home today.   Objective: Vital signs in last 24 hours: Temp:  [98 F (36.7 C)-98.6 F (37 C)] 98.6 F (37 C) (04/27 0811) Pulse Rate:  [95-116] 115 (04/27 0958) Resp:  [17-20] 17 (04/27 0829) BP: (146-173)/(56-85) 153/74 (04/27 0958) SpO2:  [98 %-100 %] 98 % (04/27 0811)  Patient Vitals for the past 24 hrs:  BP Temp Temp src Pulse Resp SpO2  12/04/19 0958 (!) 153/74 -- -- (!) 115 -- --  12/04/19 0829 (!) 168/72 -- -- (!) 112 17 --  12/04/19 0811 (!) 173/76 98.6 F (37 C) Oral (!) 116 -- 98 %  12/04/19 0433 (!) 149/72 98.2 F (36.8 C) Oral (!) 107 20 98 %  12/04/19 0036 (!) 147/56 98.1 F (36.7 C) Oral (!) 108 20 98 %  12/03/19 1932 (!) 146/73 -- -- 97 -- --  12/03/19 1917 (!) 165/75 -- -- -- -- --  12/03/19 1902 (!) 168/85 98 F (36.7 C) Oral (!) 106 18 100 %  12/03/19 1856 (!) 161/73 -- -- -- -- --  12/03/19 1316 (!) 149/75 -- -- (!) 104 -- --  12/03/19 1305 (!) 146/67 -- -- 95 -- --  12/03/19 1244 (!) 164/74 -- -- (!) 113 -- --  12/03/19 1223 (!) 163/77 98.6 F (37 C) Oral (!) 111 18 100 %   Physical Exam:  General: alert, cooperative and no distress Lochia: appropriate Uterine Fundus: firm Incision: healing well, Prevena in place DVT Evaluation: No evidence of DVT seen on physical exam.  CBG (last 3)  Recent Labs    12/03/19 1921 12/03/19 2251 12/04/19 0928  GLUCAP 138* 171* 138*   CMP Latest Ref Rng & Units 12/03/2019 11/28/2019 05/31/2019  Glucose 70 - 99 mg/dL 148(H) 82 237(H)  BUN 6 - 20 mg/dL 10 11 10   Creatinine 0.44 - 1.00 mg/dL 0.73 0.69 0.63  Sodium 135 - 145 mmol/L 138 135 135  Potassium 3.5 - 5.1 mmol/L 4.1 4.1 4.2   Chloride 98 - 111 mmol/L 101 102 99  CO2 22 - 32 mmol/L 22 21(L) 19(L)  Calcium 8.9 - 10.3 mg/dL 9.7 9.2 9.7  Total Protein 6.5 - 8.1 g/dL 7.0 6.6 6.5  Total Bilirubin 0.3 - 1.2 mg/dL 0.8 0.4 0.2  Alkaline Phos 38 - 126 U/L 75 65 74  AST 15 - 41 U/L 37 22 27  ALT 0 - 44 U/L 29 15 28    CBC Latest Ref Rng & Units 12/03/2019 11/29/2019 11/28/2019  WBC 4.0 - 10.5 K/uL 11.1(H) 13.1(H) 14.0(H)  Hemoglobin 12.0 - 15.0 g/dL 10.9(L) 9.8(L) 13.2  Hematocrit 36.0 - 46.0 % 34.4(L) 30.6(L) 41.3  Platelets 150 - 400 K/uL 399 296 293    Assessment/Plan: Status post Cesarean section. Postpartum course complicated by difficult to control BP.  >CHTN with superimposed severe preeclampsia:   Patient still with severe range BP overnight. Hydralazine 25 mg po tid added this morning on top of Enalapril 20 mg bid, Procardia 60 mg bid, Chlorthalidone 25 mg po qd added. Stable PEC labs yesterday. Discussed patient with Dr. Loma Boston (OB specialist and Hospitalist, also patient's outpatient provider at [facility]) and he recommended further BP checks today. If stable after her morning  doses, she can can be discharged and will follow up with him in the office on 12/06/19 for further management.  He and I discussed this plan with patient,; she is agreeable to this plan. She has not received any further IV Labetalol since yesterday.   >Type 2 diabetes CBG improving. Will continue metformin and levimir sliding scale  Continue current postpartum care.  Discharge planning pending improvement in BP.  Verita Schneiders, MD 12/04/2019, 10:00 AM

## 2019-12-04 NOTE — Progress Notes (Signed)
Discharge instructions reviewed with patient. Pt. Verbalized understanding of medications and follow up care.  Pt. Discharged home with significant other.  Walked off unit with NT.

## 2019-12-05 ENCOUNTER — Ambulatory Visit: Payer: Self-pay

## 2019-12-05 ENCOUNTER — Ambulatory Visit: Payer: Medicaid Other

## 2019-12-05 NOTE — Lactation Note (Addendum)
This note was copied from a baby's chart. Lactation Consultation Note  Patient Name: Sandra Mason [id number] Date: 12/05/2019 Reason for consult: Follow-up assessment;NICU baby;Early term 37-38.6wks  43 - [place] student went to check status of patient in this room. Ms. Altemus was not in the room at this time. Recommend LC to follow up later today or tomorrow am.   RN states that Ms. Mittleman is likely out for the rest of the day and will probably return back tomorrow.   Consult Status Consult Status: Follow-up Date: 12/06/19 Follow-up type: In-patient    Lenore Manner 12/05/2019, 4:47 PM

## 2019-12-05 NOTE — Lactation Note (Signed)
This note was copied from a baby's chart. Lactation Consultation Note  Patient Name: Sandra Mason [id number] Date: 12/05/2019   [facility] student attempted to complete a follow-up visit with Ms. Nauman, but she was not in room at the time.    Feeding Feeding Type: Formula Nipple Type: Nfant Standard Flow (white)     Onalee Hua 12/05/2019, 4:21 PM

## 2019-12-06 ENCOUNTER — Encounter: Payer: Self-pay | Admitting: Family Medicine

## 2019-12-06 ENCOUNTER — Ambulatory Visit (INDEPENDENT_AMBULATORY_CARE_PROVIDER_SITE_OTHER): Payer: Medicaid Other | Admitting: Family Medicine

## 2019-12-06 ENCOUNTER — Other Ambulatory Visit: Payer: Self-pay

## 2019-12-06 VITALS — BP 134/70 | HR 107 | Ht 61.0 in | Wt 239.0 lb

## 2019-12-06 DIAGNOSIS — O10919 Unspecified pre-existing hypertension complicating pregnancy, unspecified trimester: Secondary | ICD-10-CM

## 2019-12-06 DIAGNOSIS — Z5189 Encounter for other specified aftercare: Secondary | ICD-10-CM

## 2019-12-06 NOTE — Progress Notes (Signed)
   Subjective:    Patient ID: Sandra Mason, female    DOB: 1981-07-05, 39 y.o.   MRN: [id number]  HPI Seen for BP check and wound check. Wound vac removed. Incision clean, dry, intact. No areas of erythema or drainage.  Review of Systems     Objective:   Physical Exam Vitals reviewed.  Constitutional:      Appearance: Normal appearance.  Cardiovascular:     Rate and Rhythm: Normal rate.  Pulmonary:     Effort: Pulmonary effort is normal.  Abdominal:    Skin:    Capillary Refill: Capillary refill takes less than 2 seconds.  Neurological:     Mental Status: She is alert.       Assessment & Plan:  1. Visit for wound check Keep incision clean and dry. May shower - allow soapy water to run over it, pat dry. No scrubbing. Return in 1 week to monitor incision. Has high risk of wound complications due to obesity. If becomes red, purulent discharge, pain - return sooner.  2. Chronic hypertension affecting pregnancy BP controlled.

## 2019-12-12 ENCOUNTER — Ambulatory Visit: Payer: Medicaid Other | Admitting: Obstetrics & Gynecology

## 2019-12-14 ENCOUNTER — Ambulatory Visit (INDEPENDENT_AMBULATORY_CARE_PROVIDER_SITE_OTHER): Payer: Medicaid Other | Admitting: Obstetrics & Gynecology

## 2019-12-14 ENCOUNTER — Encounter: Payer: Self-pay | Admitting: Obstetrics & Gynecology

## 2019-12-14 ENCOUNTER — Other Ambulatory Visit: Payer: Self-pay

## 2019-12-14 VITALS — BP 118/56 | HR 129 | Ht 61.0 in | Wt 235.0 lb

## 2019-12-14 DIAGNOSIS — Z5189 Encounter for other specified aftercare: Secondary | ICD-10-CM | POA: Diagnosis not present

## 2019-12-14 DIAGNOSIS — O8601 Infection of obstetric surgical wound, superficial incisional site: Secondary | ICD-10-CM

## 2019-12-14 DIAGNOSIS — O119 Pre-existing hypertension with pre-eclampsia, unspecified trimester: Secondary | ICD-10-CM

## 2019-12-14 DIAGNOSIS — T8149XA Infection following a procedure, other surgical site, initial encounter: Secondary | ICD-10-CM

## 2019-12-14 DIAGNOSIS — O115 Pre-existing hypertension with pre-eclampsia, complicating the puerperium: Secondary | ICD-10-CM

## 2019-12-14 MED ORDER — FLUCONAZOLE 150 MG PO TABS: 150.0000 mg | ORAL_TABLET | Freq: Once | ORAL | 3 refills | Status: AC

## 2019-12-14 MED ORDER — CEFADROXIL 500 MG PO CAPS: 500.0000 mg | ORAL_CAPSULE | Freq: Two times a day (BID) | ORAL | 0 refills | Status: AC

## 2019-12-14 NOTE — Progress Notes (Signed)
   POSTPARTUM OFFICE VISIT NOTE  History:   Sandra Mason is a 39 y.o. [id number] here today for cesarean section incision check.  She is s/p RCS and BTS on [id number], pregnancy complicated by [facility] with superimposed severe preeclampsia, morbid obesity and T2DM.  Had Prevena placed after surgery, was removed last week.  Today, denies any incisional pain or drainage.  She and baby are doing well at home, she is breastfeeding. No concerns.    The following portions of the patient's history were reviewed and updated as appropriate: allergies, current medications, past family history, past medical history, past social history, past surgical history and problem list.   Review of Systems:  Pertinent items noted in HPI and remainder of comprehensive ROS otherwise negative.  Physical Exam:  BP (!) 118/56   Pulse (!) 129   Ht 5\' 1"  (1.549 m)   Wt 235 lb (106.6 kg)   LMP 03/04/2019 (Exact Date)   BMI 44.40 kg/m  CONSTITUTIONAL: Well-developed, well-nourished female in no acute distress.  MUSCULOSKELETAL: Normal range of motion. No edema noted. NEUROLOGIC: Alert and oriented to person, place, and time. Normal muscle tone coordination. No cranial nerve deficit noted. PSYCHIATRIC: Normal mood and affect. Normal behavior. Normal judgment and thought content. CARDIOVASCULAR: Normal heart rate noted RESPIRATORY: Effort and breath sounds normal, no problems with respiration noted ABDOMEN: Obese, large darkened indurated area on pannus above incision.  Blanching erythema around incision especially inferiorly (on the mons pubis). No pain on palpation, no drainage.  Unable to palpate discrete masses in pannus but this was difficult due to habitus.  Incision is intact, no signs of dehiscence. PELVIC: Deferred     Assessment and Plan:      1. Visit for wound check 2. Postoperative wound cellulitis Duricef prescribed for cellulitis, Diflucan prescribed if needed for rebound yeast infection. Will re-evaluate in  one week. If not improving, consider addition of Clindamycin for MRSA/anerobic coverage vs imaging. - cefadroxil (DURICEF) 500 MG capsule; Take 1 capsule (500 mg total) by mouth 2 (two) times daily for 10 days.  Dispense: 20 capsule; Refill: 0 - fluconazole (DIFLUCAN) 150 MG tablet; Take 1 tablet (150 mg total) by mouth once for 1 dose. Can take additional dose three days later if symptoms persist  Dispense: 1 tablet; Refill: 3  3. Chronic hypertension with superimposed severe preeclampsia Stable BP today. Continue Nifedipine XR, Enalapril, Chlorthalidone and Hydralazine. May start to cut back on some meds in the next few weeks.   Please refer to After Visit Summary for other counseling recommendations.   Return in about 1 week (around 12/21/2019) for Incision recheck, cellulits followup.     Verita Schneiders, MD, [facility] [employer] [facility], [place]

## 2019-12-14 NOTE — Patient Instructions (Signed)

## 2019-12-21 ENCOUNTER — Other Ambulatory Visit: Payer: Self-pay

## 2019-12-21 ENCOUNTER — Ambulatory Visit (INDEPENDENT_AMBULATORY_CARE_PROVIDER_SITE_OTHER): Payer: Medicaid Other | Admitting: Obstetrics & Gynecology

## 2019-12-21 ENCOUNTER — Encounter: Payer: Self-pay | Admitting: Obstetrics & Gynecology

## 2019-12-21 VITALS — BP 132/62 | HR 126 | Ht 61.0 in | Wt 235.0 lb

## 2019-12-21 DIAGNOSIS — Z5189 Encounter for other specified aftercare: Secondary | ICD-10-CM

## 2019-12-21 DIAGNOSIS — T8149XA Infection following a procedure, other surgical site, initial encounter: Secondary | ICD-10-CM

## 2019-12-21 DIAGNOSIS — O8601 Infection of obstetric surgical wound, superficial incisional site: Secondary | ICD-10-CM

## 2019-12-21 NOTE — Progress Notes (Signed)
Patient presents for recheck of incision. Postpartum 3 weeks. Kathrene Alu RN

## 2019-12-21 NOTE — Progress Notes (Signed)
History:  39 y.o. [id number] here today for post op incision check. Pt is s/p c-section 11/28/2019. She was seen by Dr. Harolyn Rutherford and started on atbx for wound cellulitis. The patient has 4 days left of the meds. She reports a rash on her arms that started PRIOR to started the atbx. It is itchy but, not getting worse.  The following portions of the patient's history were reviewed and updated as appropriate: allergies, current medications, past family history, past medical history, past social history, past surgical history and problem list.  Review of Systems:  Pertinent items are noted in HPI.    Objective:  Physical Exam Blood pressure 132/62, pulse (!) 126, height 5\' 1"  (1.549 m), weight 235 lb (106.6 kg), last menstrual period 03/04/2019, currently breastfeeding.  CONSTITUTIONAL: Well-developed, well-nourished female in no acute distress.  HENT:  Normocephalic, atraumatic EYES: Conjunctivae and EOM are normal. No scleral icterus.  NECK: Normal range of motion SKIN: Skin is warm and dry. No rash noted. Not diaphoretic.No pallor. [place]: Alert and oriented to person, place, and time. Normal coordination.  Abd: Soft, nontender and nondistended; large panus. There is peau d'orange changes at the  base of the pannus that is improved from her ante partum state. The incision is C/D/I and there is no induration or fluctuance.   Pelvic:  Not indicated.   Assessment & Plan:  Post op would check.   Complete atbx  F/u in 3 weeks for routine PP check  Obediah Welles L. Harraway-Smith, M.D., [place]nn June

## 2019-12-27 ENCOUNTER — Ambulatory Visit (INDEPENDENT_AMBULATORY_CARE_PROVIDER_SITE_OTHER): Payer: Medicaid Other | Admitting: Family Medicine

## 2019-12-27 ENCOUNTER — Encounter: Payer: Self-pay | Admitting: Family Medicine

## 2019-12-27 ENCOUNTER — Other Ambulatory Visit: Payer: Self-pay

## 2019-12-27 VITALS — BP 117/62 | HR 120 | Ht 61.0 in | Wt 234.0 lb

## 2019-12-27 DIAGNOSIS — L309 Dermatitis, unspecified: Secondary | ICD-10-CM | POA: Diagnosis not present

## 2019-12-27 DIAGNOSIS — Z09 Encounter for follow-up examination after completed treatment for conditions other than malignant neoplasm: Secondary | ICD-10-CM | POA: Diagnosis not present

## 2019-12-27 DIAGNOSIS — E119 Type 2 diabetes mellitus without complications: Secondary | ICD-10-CM | POA: Diagnosis not present

## 2019-12-27 DIAGNOSIS — T8149XA Infection following a procedure, other surgical site, initial encounter: Secondary | ICD-10-CM

## 2019-12-27 DIAGNOSIS — I10 Essential (primary) hypertension: Secondary | ICD-10-CM

## 2019-12-27 DIAGNOSIS — Z794 Long term (current) use of insulin: Secondary | ICD-10-CM

## 2019-12-27 MED ORDER — PREDNISONE 20 MG PO TABS: 20.0000 mg | ORAL_TABLET | ORAL | 0 refills | Status: DC

## 2019-12-27 NOTE — Telephone Encounter (Signed)
Patient came in for evaluation

## 2019-12-27 NOTE — Progress Notes (Signed)
   Subjective:    Patient ID: Sandra Mason, female    DOB: [birth year], 39 y.o.   MRN: [id number]  HPI Patient seen for follow up of skin incision. She had been treated with antibiotic for infection. She reports that the skin is draining yellow discharge on pad. No pain.   Also has rash on arms that has spread to legs. Very pruritic, erythematous. Benadryl, claritin, topical benadryl not helping. No new soaps, lotions, detergents.    Review of Systems     Objective:   Physical Exam Vitals reviewed.  Constitutional:      Appearance: She is obese.  Cardiovascular:     Rate and Rhythm: Normal rate.  Abdominal:     Comments: Peau d'orange changes at the  base of the pannus. Middle edge of peau d'orange is part of the incision. The subcutaneous skin is exposed in that middle area. Silver nitrate applied. The incision is C/D/I and there is no induration or fluctuance.   Skin:    Capillary Refill: Capillary refill takes less than 2 seconds.     Comments: Raised ectopic appearing skin on arms and legs.   Neurological:     Mental Status: She is alert.  Psychiatric:        Mood and Affect: Mood normal.        Behavior: Behavior normal.        Thought Content: Thought content normal.        Judgment: Judgment normal.       Assessment & Plan:  1. Postoperative wound cellulitis Resolved.   2. Dermatitis Uncertain of etiology - not improved with antihistamine. Start prednisone. Will increase levemir to 15 units daily while on steriods.  3. Chronic hypertension BP normal today off medications.  4. Type 2 diabetes mellitus without complication, with long-term current use of insulin ([place])

## 2020-01-09 ENCOUNTER — Ambulatory Visit: Payer: Medicaid Other | Admitting: Obstetrics & Gynecology

## 2020-01-16 ENCOUNTER — Other Ambulatory Visit: Payer: Self-pay

## 2020-01-16 ENCOUNTER — Ambulatory Visit (INDEPENDENT_AMBULATORY_CARE_PROVIDER_SITE_OTHER): Payer: Medicaid Other | Admitting: Obstetrics & Gynecology

## 2020-01-16 ENCOUNTER — Encounter: Payer: Self-pay | Admitting: Obstetrics & Gynecology

## 2020-01-16 VITALS — BP 129/68 | HR 129 | Temp 99.1°F | Ht 61.0 in | Wt 236.0 lb

## 2020-01-16 DIAGNOSIS — S3092XA Unspecified superficial injury of abdominal wall, initial encounter: Secondary | ICD-10-CM

## 2020-01-16 DIAGNOSIS — E669 Obesity, unspecified: Secondary | ICD-10-CM

## 2020-01-16 DIAGNOSIS — L089 Local infection of the skin and subcutaneous tissue, unspecified: Secondary | ICD-10-CM

## 2020-01-16 DIAGNOSIS — E65 Localized adiposity: Secondary | ICD-10-CM | POA: Diagnosis not present

## 2020-01-16 DIAGNOSIS — Z6841 Body Mass Index (BMI) 40.0 and over, adult: Secondary | ICD-10-CM

## 2020-01-16 MED ORDER — DICLOXACILLIN SODIUM 500 MG PO CAPS: 500.0000 mg | ORAL_CAPSULE | Freq: Three times a day (TID) | ORAL | 0 refills | Status: DC

## 2020-01-16 NOTE — Patient Instructions (Signed)
The Journal of Orthopaedic and Sports Physical Therapy, 44(10), 748. [email or web address]">  How to Increase Your Level of Physical Activity Getting regular physical activity is important for your overall health and well-being. Most people do not get enough exercise. There are easy ways to increase your level of physical activity, even if you have not been very active in the past or if you are just starting out. How can increasing my physical activity affect me? Physical activity has many short-term and long-term benefits. Being active on a regular basis can improve your physical and mental health as well as provide other benefits. Physical health benefits  Helping you lose weight or maintain a healthy weight.  Strengthening your muscles and bones.  Reducing your risk of certain long-term (chronic) diseases, including heart disease, cancer, and diabetes.  Being able to move around more easily and for longer periods of time without getting tired (increased stamina).  Improving your ability to fight off illness (enhanced immunity).  Being able to sleep better.  Helping you stay healthy as you get older, including: ? Helping you stay mobile, or capable of walking and moving around. ? Preventing accidents, such as falls. ? Increasing life expectancy. Mental health benefits  Boosting your mood and improving your self-esteem.  Lowering your chance of having mental health problems, such as depression or anxiety.  Helping you feel good about your body. Other benefits  Finding new sources of fun and enjoyment.  Meeting new people who share a common interest. What steps can I take to be more physically active? Getting started  If you have a chronic illness or have not been active for a while, check with your health care provider about how to get started. Ask your health care provider what activities are safe for you.  Start out slowly. Walking or doing some simple  chair exercises is a good place to start, especially if you have not been active before or for a long time.  Set goals that you can work toward. Ask your health care provider how much exercise is best for you. In general, most adults should: ? Do moderate-intensity exercise for at least 150 minutes each week (30 minutes on most days of the week) or vigorous exercise for at least 75 minutes each week, or a combination of these.  Moderate-intensity exercise can include walking at a quick pace, biking, yoga, water aerobics, or gardening.  Vigorous exercise involves activities that take more effort, such as jogging or running, playing sports, swimming laps, or jumping rope. ? Do strength exercises on at least 2 days each week. This can include weight lifting, body weight exercises, and resistance-band exercises.  Consider using a fitness tracker, such as a mobile phone app or a device worn like a watch, that will count the number of steps you take each day. Many people strive to reach 10,000 steps a day. Choosing activities  Try to find activities that you enjoy. You are more likely to commit to an exercise routine if it does not feel like a chore.  If you have bone or joint problems, choose low-impact exercises, like walking or swimming.  Use these tips for being successful with an exercise plan: ? Find a workout partner for accountability. ? Join a group or class, such as an aerobics class, cycling class, or sports team. ? Make family time active. Go for a walk, bike, or swim. ? Include a variety of exercises each week. Being active in your daily routines Besides your formal exercise  plans, you can find ways to do physical activity during your daily routines, such as:  Walking or biking to work or to the store.  Taking the stairs instead of the elevator.  Parking farther away from the door at work or at the store.  Planning walking meetings.  Walking around while you are on the  phone.  Where to find more information  Centers for Disease Control and Prevention: WorkDashboard.es  President's Council on Fitness, Sports & Nutrition: www.fitness.gov  ChooseMyPlate: FormerBoss.no Contact a health care provider if:  You have headaches, muscle aches, or joint pain.  You feel dizzy or light-headed while exercising.  You faint.  You have chest pain while exercising. Summary  Exercise benefits your mind and body at any age, even if you are just starting out.  If you have a chronic illness or have not been active for a while, check with your health care provider before increasing your physical activity.  Choose activities that are safe and enjoyable for you. Ask your health care provider what activities are safe for you.  Start slowly. Tell your health care provider if you have problems as you start to increase your activity level. This information is not intended to replace advice given to you by your health care provider. Make sure you discuss any questions you have with your health care provider. Document Revised: 04/30/2019 Document Reviewed: 02/19/2019 Elsevier Patient Education  [place]. Exercising to Lose Weight Exercise is structured, repetitive physical activity to improve fitness and health. Getting regular exercise is important for everyone. It is especially important if you are overweight. Being overweight increases your risk of heart disease, stroke, diabetes, high blood pressure, and several types of cancer. Reducing your calorie intake and exercising can help you lose weight. Exercise is usually categorized as moderate or vigorous intensity. To lose weight, most people need to do a certain amount of moderate-intensity or vigorous-intensity exercise each week. Moderate-intensity exercise  Moderate-intensity exercise is any activity that gets you moving enough to burn at least three times more energy (calories) than if you  were sitting. Examples of moderate exercise include:  Walking a mile in 15 minutes.  Doing light yard work.  Biking at an easy pace. Most people should get at least 150 minutes (2 hours and 30 minutes) a week of moderate-intensity exercise to maintain their body weight. Vigorous-intensity exercise Vigorous-intensity exercise is any activity that gets you moving enough to burn at least six times more calories than if you were sitting. When you exercise at this intensity, you should be working hard enough that you are not able to carry on a conversation. Examples of vigorous exercise include:  Running.  Playing a team sport, such as football, basketball, and soccer.  Jumping rope. Most people should get at least 75 minutes (1 hour and 15 minutes) a week of vigorous-intensity exercise to maintain their body weight. How can exercise affect me? When you exercise enough to burn more calories than you eat, you lose weight. Exercise also reduces body fat and builds muscle. The more muscle you have, the more calories you burn. Exercise also:  Improves mood.  Reduces stress and tension.  Improves your overall fitness, flexibility, and endurance.  Increases bone strength. The amount of exercise you need to lose weight depends on:  Your age.  The type of exercise.  Any health conditions you have.  Your overall physical ability. Talk to your health care provider about how much exercise you need and what  types of activities are safe for you. What actions can I take to lose weight? Nutrition   Make changes to your diet as told by your health care provider or diet and nutrition specialist (dietitian). This may include: ? Eating fewer calories. ? Eating more protein. ? Eating less unhealthy fats. ? Eating a diet that includes fresh fruits and vegetables, whole grains, low-fat dairy products, and lean protein. ? Avoiding foods with added fat, salt, and sugar.  Drink plenty of water  while you exercise to prevent dehydration or heat stroke. Activity  Choose an activity that you enjoy and set realistic goals. Your health care provider can help you make an exercise plan that works for you.  Exercise at a moderate or vigorous intensity most days of the week. ? The intensity of exercise may vary from person to person. You can tell how intense a workout is for you by paying attention to your breathing and heartbeat. Most people will notice their breathing and heartbeat get faster with more intense exercise.  Do resistance training twice each week, such as: ? Push-ups. ? Sit-ups. ? Lifting weights. ? Using resistance bands.  Getting short amounts of exercise can be just as helpful as long structured periods of exercise. If you have trouble finding time to exercise, try to include exercise in your daily routine. ? Get up, stretch, and walk around every 30 minutes throughout the day. ? Go for a walk during your lunch break. ? Park your car farther away from your destination. ? If you take public transportation, get off one stop early and walk the rest of the way. ? Make phone calls while standing up and walking around. ? Take the stairs instead of elevators or escalators.  Wear comfortable clothes and shoes with good support.  Do not exercise so much that you hurt yourself, feel dizzy, or get very short of breath. Where to find more information  U.S. Department of Health and Human Services: BondedCompany.at  Centers for Disease Control and Prevention (CDC): http://www.wolf.info/ Contact a health care provider:  Before starting a new exercise program.  If you have questions or concerns about your weight.  If you have a medical problem that keeps you from exercising. Get help right away if you have any of the following while exercising:  Injury.  Dizziness.  Difficulty breathing or shortness of breath that does not go away when you stop exercising.  Chest pain.  Rapid  heartbeat. Summary  Being overweight increases your risk of heart disease, stroke, diabetes, high blood pressure, and several types of cancer.  Losing weight happens when you burn more calories than you eat.  Reducing the amount of calories you eat in addition to getting regular moderate or vigorous exercise each week helps you lose weight. This information is not intended to replace advice given to you by your health care provider. Make sure you discuss any questions you have with your health care provider. Document Revised: 08/08/2017 Document Reviewed: 08/08/2017 Elsevier Patient Education  2020 [employer].

## 2020-01-16 NOTE — Progress Notes (Signed)
History:  39 y.o. [id number] here today for eval of drainage from abd. She is s/p c/section on 11/28/2019. She denies fever or chills.      The following portions of the patient's history were reviewed and updated as appropriate: allergies, current medications, past family history, past medical history, past social history, past surgical history and problem list.  Review of Systems:  Pertinent items are noted in HPI.    Objective:  Physical Exam Blood pressure 129/68, pulse (!) 129, temperature 99.1 F (37.3 C), height 5\' 1"  (1.549 m), weight 236 lb (107 kg), currently breastfeeding. BP 129/68   Pulse (!) 129   Temp 99.1 F (37.3 C)   Ht 5\' 1"  (1.549 m)   Wt 236 lb (107 kg)   Breastfeeding Yes   BMI 44.59 kg/m   CONSTITUTIONAL: Well-developed, well-nourished female in no acute distress.  HENT:  Normocephalic, atraumatic EYES: Conjunctivae and EOM are normal. No scleral icterus.  NECK: Normal range of motion SKIN: Skin is warm and dry. No rash noted. Not diaphoretic.No pallor. [place]: Alert and oriented to person, place, and time. Normal coordination.  Abd: Soft, nontender and nondistended; there is a large pannus that had peau d'orange skin changes. At the base of the pannus there is non an area that is pointing. The surgical incision was completely intact and not affected.    Procedure: the area was cleaned with betadine. 3cc of 1% Lidociane was used to numb the area. Using a scalpel  the area was opened with minimal drainage. The area was then packed with a fair amount of 1/4 packing guaze. Cx  were obtained.    Assessment & Plan:  Infection of edge of pannus post op. Incision intact.   Dicloxacillin 500mg  tid x 10 days  F/u wound cx  Wound packed. F/u in 2 days to have repacked.   Obesity- reviewed the importance of weight loss  Begin walking daily  My FitnessPal write down daily food intake    Isaura Schiller L. Harraway-Smith, M.D., [place]nn June

## 2020-01-18 ENCOUNTER — Other Ambulatory Visit: Payer: Self-pay

## 2020-01-18 ENCOUNTER — Ambulatory Visit: Payer: Medicaid Other

## 2020-01-18 VITALS — Temp 98.8°F | Ht 61.0 in | Wt 236.0 lb

## 2020-01-18 DIAGNOSIS — S3092XA Unspecified superficial injury of abdominal wall, initial encounter: Secondary | ICD-10-CM

## 2020-01-18 LAB — WOUND CULTURE: Organism ID, Bacteria: NONE SEEN

## 2020-01-18 NOTE — Progress Notes (Addendum)
Patient presents for removal of packing and then repacked with new packing. Iodoform 1/4'' packing strip used. Noted clear/ light pink drainage had saturated the 4x4 covering the wound site.  Patient afebrile. Patient states that [facility] had not filled her antibiotic yet but she check while in the office with me and it was ready for pickup. Patient states she will pick up today and start antibiotic. Voiced importance of stating this as soon as possible and schedule patient to return on Monday for visit with doctor. Kathrene Alu RN   Attestation of Attending Supervision of RN: Evaluation and management procedures were performed by the nurse under my supervision and collaboration.  I have reviewed the nursing note and chart, and I agree with the management and plan.  Carolyn L. Harraway-Smith, M.D., Cherlynn June

## 2020-01-21 ENCOUNTER — Ambulatory Visit (INDEPENDENT_AMBULATORY_CARE_PROVIDER_SITE_OTHER): Payer: Medicaid Other | Admitting: Obstetrics & Gynecology

## 2020-01-21 ENCOUNTER — Encounter: Payer: Self-pay | Admitting: Obstetrics & Gynecology

## 2020-01-21 ENCOUNTER — Other Ambulatory Visit: Payer: Self-pay

## 2020-01-21 VITALS — BP 140/80 | HR 115 | Wt 235.0 lb

## 2020-01-21 DIAGNOSIS — Z794 Long term (current) use of insulin: Secondary | ICD-10-CM

## 2020-01-21 DIAGNOSIS — E119 Type 2 diabetes mellitus without complications: Secondary | ICD-10-CM

## 2020-01-21 DIAGNOSIS — S301XXA Contusion of abdominal wall, initial encounter: Secondary | ICD-10-CM

## 2020-01-21 DIAGNOSIS — O24119 Pre-existing diabetes mellitus, type 2, in pregnancy, unspecified trimester: Secondary | ICD-10-CM

## 2020-01-21 DIAGNOSIS — Z7984 Long term (current) use of oral hypoglycemic drugs: Secondary | ICD-10-CM

## 2020-01-21 DIAGNOSIS — S301XXD Contusion of abdominal wall, subsequent encounter: Secondary | ICD-10-CM

## 2020-01-21 NOTE — Progress Notes (Signed)
History:  39 y.o. [name] here today for f/u of abd wall seroma at base of pannus NOT assoc with incision. Pt reports that she started the atbx 3 days ago. She denies fever or chills. She reports that the packing fell out. She has been covering the wound with a cloth during showering. Sandra Mason is still draining a large amount of clear fluid.    The following portions of the patient's history were reviewed and updated as appropriate: allergies, current medications, past family history, past medical history, past social history, past surgical history and problem list.  Review of Systems:  Pertinent items are noted in HPI.    Objective:  Physical Exam Blood pressure 140/80, pulse (!) 115, weight 235 lb (106.6 kg), currently breastfeeding.  CONSTITUTIONAL: Well-developed, well-nourished female in no acute distress.  HENT:  Normocephalic, atraumatic EYES: Conjunctivae and EOM are normal. No scleral icterus.  NECK: Normal range of motion SKIN: Skin is warm and dry. No rash noted. Not diaphoretic.No pallor. [place]: Alert and oriented to person, place, and time. Normal coordination.  Abd: Soft, nontender and nondistended. At the base of the pannus the incision of the  seroma is still  Open. There is some eschar. There is clear drainage. The incision was cleaned and repacked.     Assessment & Plan:  Seroma of abd wall at the base of the pannus. Pt does not have anyone at home to pack the incision.   Cont q 2 day dressing change in office.   Type II DM- pt s glucose has been in the 130-140's. She cont on Metformin and Levimir.   Refer to primary care for the maintenance of the DM.  Sandra Mason, M.D., Sandra Mason

## 2020-01-23 ENCOUNTER — Other Ambulatory Visit: Payer: Self-pay

## 2020-01-23 ENCOUNTER — Ambulatory Visit (INDEPENDENT_AMBULATORY_CARE_PROVIDER_SITE_OTHER): Payer: Medicaid Other

## 2020-01-23 VITALS — HR 99 | Temp 98.9°F | Ht 61.0 in | Wt 236.0 lb

## 2020-01-23 DIAGNOSIS — S301XXD Contusion of abdominal wall, subsequent encounter: Secondary | ICD-10-CM

## 2020-01-23 DIAGNOSIS — Z4800 Encounter for change or removal of nonsurgical wound dressing: Secondary | ICD-10-CM

## 2020-01-23 NOTE — Progress Notes (Addendum)
Patient presents for removal of packing and then repacked with new packing. Iodoform 1/4'' packing strip used. Noted clear/ light pink drainage on the folded 4x4 covering wound packing.  Patient is on day 5 of her antibiotic (dicloxacillin).  Patient is afebrile. Patient will return on Friday for wound repacking.  Kathrene Alu RN   Attestation of Attending Supervision of RN: Evaluation and management procedures were performed by the nurse under my supervision and collaboration.  I have reviewed the nursing note and chart, and I agree with the management and plan.  Carolyn L. Harraway-Smith, M.D., Cherlynn June

## 2020-01-25 ENCOUNTER — Ambulatory Visit: Payer: Medicaid Other

## 2020-01-25 ENCOUNTER — Other Ambulatory Visit: Payer: Self-pay

## 2020-01-25 VITALS — Temp 98.8°F | Ht 61.0 in | Wt 236.0 lb

## 2020-01-25 DIAGNOSIS — S301XXD Contusion of abdominal wall, subsequent encounter: Secondary | ICD-10-CM

## 2020-01-25 NOTE — Progress Notes (Addendum)
Patient presents for removal of packing and repacked with Iodoform packing 1/4''. Patient denies any fevers.  Wound is healing nicely and there is considerable amount of progress from Wednesday. Width approx 3.5 cm wide and about 2 cm deep. Patient scheduled to return on Monday. Kathrene Alu RN   Attestation of Attending Supervision of RN: Evaluation and management procedures were performed by the nurse under my supervision and collaboration.  I have reviewed the nursing note and chart, and I agree with the management and plan.  Carolyn L. Harraway-Smith, M.D., Cherlynn June

## 2020-01-28 ENCOUNTER — Ambulatory Visit (INDEPENDENT_AMBULATORY_CARE_PROVIDER_SITE_OTHER): Payer: Medicaid Other | Admitting: Family Medicine

## 2020-01-28 ENCOUNTER — Encounter: Payer: Self-pay | Admitting: Family Medicine

## 2020-01-28 ENCOUNTER — Other Ambulatory Visit: Payer: Self-pay

## 2020-01-28 VITALS — BP 130/76 | HR 106 | Ht 61.0 in | Wt 232.0 lb

## 2020-01-28 DIAGNOSIS — S3092XD Unspecified superficial injury of abdominal wall, subsequent encounter: Secondary | ICD-10-CM

## 2020-01-28 DIAGNOSIS — L089 Local infection of the skin and subcutaneous tissue, unspecified: Secondary | ICD-10-CM | POA: Diagnosis not present

## 2020-01-28 NOTE — Progress Notes (Signed)
Patient seen for dressing change as she has no one at home to change it. Appears to be improving. No erythema, purulent drainage, pain, bleeding. Good granulation tissue. Packing changed. F/u in 2 days.

## 2020-01-30 ENCOUNTER — Ambulatory Visit (INDEPENDENT_AMBULATORY_CARE_PROVIDER_SITE_OTHER): Payer: Medicaid Other | Admitting: Family Medicine

## 2020-01-30 ENCOUNTER — Encounter: Payer: Self-pay | Admitting: Family Medicine

## 2020-01-30 ENCOUNTER — Other Ambulatory Visit: Payer: Self-pay

## 2020-01-30 VITALS — BP 134/79 | HR 107 | Ht 61.0 in | Wt 232.0 lb

## 2020-01-30 DIAGNOSIS — Z5189 Encounter for other specified aftercare: Secondary | ICD-10-CM | POA: Diagnosis not present

## 2020-01-30 NOTE — Progress Notes (Signed)
Patient Name: Sandra Mason, female   DOB: 1981/05/09, 39 y.o.  MRN: [id number]  Patient here for wound change. 1cm deep defect, which is greatly improved. Packed with iodoform ribbon 1/4". Will f/u in 2 days for recheck. Should be ok to stop packing at that point.  Truett Mainland, DO

## 2020-02-01 ENCOUNTER — Ambulatory Visit: Payer: Medicaid Other | Admitting: Family Medicine

## 2020-02-01 ENCOUNTER — Other Ambulatory Visit: Payer: Self-pay

## 2020-02-01 ENCOUNTER — Ambulatory Visit (INDEPENDENT_AMBULATORY_CARE_PROVIDER_SITE_OTHER): Payer: Medicaid Other | Admitting: Family Medicine

## 2020-02-01 ENCOUNTER — Encounter: Payer: Self-pay | Admitting: Family Medicine

## 2020-02-01 VITALS — BP 109/57 | HR 106 | Temp 98.4°F | Ht 61.0 in | Wt 232.0 lb

## 2020-02-01 DIAGNOSIS — S301XXD Contusion of abdominal wall, subsequent encounter: Secondary | ICD-10-CM | POA: Diagnosis not present

## 2020-02-01 NOTE — Progress Notes (Signed)
Patient here for wound change. Still about 1cm deep defect, but still greatly improved. Drainage minimal. Packed with iodoform ribbon 1/4". Will f/u on Monday for recheck and PP visit.  Truett Mainland, DO

## 2020-02-04 ENCOUNTER — Other Ambulatory Visit: Payer: Self-pay

## 2020-02-04 ENCOUNTER — Encounter: Payer: Self-pay | Admitting: Obstetrics & Gynecology

## 2020-02-04 ENCOUNTER — Ambulatory Visit (INDEPENDENT_AMBULATORY_CARE_PROVIDER_SITE_OTHER): Payer: Medicaid Other | Admitting: Obstetrics & Gynecology

## 2020-02-04 VITALS — BP 118/75 | HR 108 | Ht 61.0 in | Wt 234.0 lb

## 2020-02-04 DIAGNOSIS — Z3009 Encounter for other general counseling and advice on contraception: Secondary | ICD-10-CM

## 2020-02-04 DIAGNOSIS — O2493 Unspecified diabetes mellitus in the puerperium: Secondary | ICD-10-CM

## 2020-02-04 MED ORDER — NORETHINDRONE 0.35 MG PO TABS: 1.0000 | ORAL_TABLET | Freq: Every day | ORAL | 6 refills | Status: AC

## 2020-02-04 NOTE — Progress Notes (Signed)
    [place] Partum Visit Note  Sandra Mason is a 39 y.o. [id number] female who presents for a postpartum visit. She is 8 weeks postpartum following a repeat cesarean section.  I have fully reviewed the prenatal and intrapartum course. The delivery was at 10 gestational weeks.  Anesthesia: epidural. Postpartum course has been complicated by drainage at that edge of her panus that is NOT assoc with the wound or incision. She reports that the leakage is much less than prev. Baby is doing well. Baby is feeding by breast. Bleeding no bleeding. Bowel function is normal. Bladder function is normal. Patient is sexually active. Contraception method is vasectomy. Postpartum depression screening: negative.  Pt reports that her husband is getting a vasectomy.   The following portions of the patient's history were reviewed and updated as appropriate: allergies, current medications, past family history, past medical history, past social history, past surgical history and problem list.  Review of Systems Pertinent items are noted in HPI.    Objective:  Blood pressure 118/75, pulse (!) 108, height 5\' 1"  (1.549 m), weight 234 lb (106.1 kg), last menstrual period 01/21/2020, currently breastfeeding.   CONSTITUTIONAL: Well-developed, well-nourished female in no acute distress.  HENT:  Normocephalic, atraumatic EYES: Conjunctivae and EOM are normal. No scleral icterus.  NECK: Normal range of motion SKIN: Skin is warm and dry. No rash noted. Not diaphoretic.No pallor. [place]: Alert and oriented to person, place, and time. Normal coordination.  ABD: The peau d' orange area at the edge of her pannus is resolving. The wound is almost completely closed. There is no room for further packing at present.     Assessment:    8 week postpartum exam. Pap smear not done at today's visit.   Plan:   Essential components of care per ACOG recommendations:  1.  Mood and well being: Patient with negative depression  screening today. Reviewed local resources for support.  - Patient does not use tobacco. - hx of drug use? No  2. Infant care and feeding:  -Patient currently breastmilk feeding? Yes If breastmilk feeding discussed return to work and pumping- Pt is at home with infant.-Social determinants of health (SDOH) reviewed in EPIC. Pt recently got the full Medicaid so has care resources.  3. Sexuality, contraception and birth spacing - Patient does not want a pregnancy in the next year.  Desired family size is complete - Reviewed forms of contraception in tiered fashion. Patient desired oral progesterone-only contraceptive today.   She will take this until her husband is completed and cleared from his vasectomy.   4. Sleep and fatigue -Encouraged family/partner/community support of 4 hrs of uninterrupted sleep to help with mood and fatigue. At present, baby wakes every 5 hours. p  5. Physical Recovery  - Patient is safe to resume physical and sexual activity  6.  Health Maintenance - Last pap smear done 2020 and was normal with negative HPV.  7. Chronic Disease- DM - PCP follow up-  Pt will f/u at [facility] for DM management   Referral to nutritionist.   Pt is using [employer].   Pt is walking 4 times per week   Lavonia Drafts, MD [facility], [place]

## 2020-02-04 NOTE — Patient Instructions (Signed)

## 2020-02-05 ENCOUNTER — Ambulatory Visit: Payer: Medicaid Other | Admitting: Dietician

## 2020-03-17 ENCOUNTER — Ambulatory Visit: Payer: Medicaid Other | Admitting: Obstetrics & Gynecology

## 2020-06-28 ENCOUNTER — Other Ambulatory Visit: Payer: Self-pay

## 2020-06-28 ENCOUNTER — Encounter (HOSPITAL_BASED_OUTPATIENT_CLINIC_OR_DEPARTMENT_OTHER): Payer: Self-pay | Admitting: *Deleted

## 2020-06-28 ENCOUNTER — Emergency Department (HOSPITAL_BASED_OUTPATIENT_CLINIC_OR_DEPARTMENT_OTHER)
Admission: EM | Admit: 2020-06-28 | Discharge: 2020-06-29 | Disposition: A | Discharge status: Home / Self Care | Payer: Medicaid Other | Attending: Emergency Medicine | Admitting: Emergency Medicine

## 2020-06-28 DIAGNOSIS — M25552 Pain in left hip: Secondary | ICD-10-CM | POA: Diagnosis present

## 2020-06-28 DIAGNOSIS — M898X8 Other specified disorders of bone, other site: Secondary | ICD-10-CM

## 2020-06-28 DIAGNOSIS — E119 Type 2 diabetes mellitus without complications: Secondary | ICD-10-CM | POA: Diagnosis not present

## 2020-06-28 DIAGNOSIS — I10 Essential (primary) hypertension: Secondary | ICD-10-CM | POA: Insufficient documentation

## 2020-06-28 DIAGNOSIS — Z794 Long term (current) use of insulin: Secondary | ICD-10-CM | POA: Insufficient documentation

## 2020-06-28 DIAGNOSIS — Z7984 Long term (current) use of oral hypoglycemic drugs: Secondary | ICD-10-CM | POA: Diagnosis not present

## 2020-06-28 MED ORDER — IBUPROFEN 800 MG PO TABS
800.0000 mg | ORAL_TABLET | Freq: Once | ORAL | Status: AC
  Administered 2020-06-28: 800 mg via ORAL
  Filled 2020-06-28: qty 1

## 2020-06-28 NOTE — ED Triage Notes (Signed)
Pt reports onset of left hip pain today when she bent over. Denies pain at rest. Pain increases with movement. Denies Fall

## 2020-06-28 NOTE — ED Provider Notes (Signed)
[place] [facility] Provider Note: Georgena Spurling, MD, FACEP  CSN: [id number] MRN: [id number] ARRIVAL: 06/28/20 at 2149 ROOM: [place]  Hip Pain   HISTORY OF PRESENT ILLNESS  06/28/20 11:21 PM Sandra Mason is a 39 y.o. female who felt a sudden onset of pain in her left hip (actually the left anterior, superior iliac spine) while bending over.  She now rates the pain as an 8 out of 10.  It is worse when she is ambulating.  When lying supine it is minimal and she is able to lift her left leg without difficulty.  She has not taken anything for it.   Past Medical History:  Diagnosis Date  . Diabetes mellitus without complication ([place])   . Gestational diabetes   . Hypertension     Past Surgical History:  Procedure Laterality Date  . CESAREAN SECTION    . CESAREAN SECTION WITH BILATERAL TUBAL LIGATION N/A 11/28/2019   Procedure: CESAREAN SECTION WITH BILATERAL TUBAL LIGATION;  Surgeon: Florian Buff, MD;  Location: [facility] LD ORS;  Service: Obstetrics;  Laterality: N/A;    Family History  Problem Relation Age of Onset  . Diabetes Mother   . Hypertension Mother   . Lung cancer Father     Social History   Tobacco Use  . Smoking status: Never Smoker  . Smokeless tobacco: Never Used  Vaping Use  . Vaping Use: Never used  Substance Use Topics  . Alcohol use: Never  . Drug use: Never    Prior to Admission medications   Medication Sig Start Date End Date Taking? Authorizing Provider  Accu-Chek FastClix Lancets MISC 1 Units by Percutaneous route 4 (four) times daily. 05/25/19   Truett Mainland, DO  Ascorbic Acid (VITAMIN C) 1000 MG tablet Take 1,000 mg by mouth daily.    [provider]  Blood Glucose Monitoring Suppl (ACCU-CHEK NANO SMARTVIEW) w/Device KIT 1 kit by Subdermal route as directed. Check blood sugars for fasting, and two hours after breakfast, lunch and dinner (4 checks daily) 05/25/19   Truett Mainland, DO  glucose blood test  strip DX: O24.419 Gestational diabetes check BS QID. 06/25/19   Truett Mainland, DO  ibuprofen (ADVIL) 800 MG tablet Take 1 tablet (800 mg total) by mouth every 8 (eight) hours as needed (for pain). 06/29/20   Brysan Mcevoy, MD  insulin detemir (LEVEMIR) 100 UNIT/ML injection 5 units q hs 11/30/19   Donnamae Jude, MD  Insulin Syringe-Needle U-100 (INSULIN SYRINGE .5CC/31GX5/16") 31G X 5/16" 0.5 ML MISC 1 Units by Does not apply route 5 (five) times daily. 06/08/19   Truett Mainland, DO  metFORMIN (GLUCOPHAGE) 1000 MG tablet Take 1 tablet (1,000 mg total) by mouth 2 (two) times daily with a meal. 11/30/19   Donnamae Jude, MD  norethindrone (MICRONOR) 0.35 MG tablet Take 1 tablet (0.35 mg total) by mouth daily. 02/04/20   Lavonia Drafts, MD    Allergies Patient has no known allergies.   REVIEW OF SYSTEMS  Negative except as noted here or in the History of Present Illness.   PHYSICAL EXAMINATION  Initial Vital Signs Blood pressure (!) 167/72, pulse (!) 119, temperature 98.8 F (37.1 C), temperature source Oral, resp. rate 18, height $RemoveBe'5\' 1"'yvQvhnTUq$  (1.549 m), weight 105.7 kg, last menstrual period 06/01/2020, SpO2 99 %, currently breastfeeding.  Examination General: Well-developed, well-nourished female in no acute distress; appearance consistent with age of record HENT: normocephalic; atraumatic Eyes: Normal appearance Neck: supple  Heart: regular rate and rhythm Lungs: clear to auscultation bilaterally Abdomen: soft; nondistended; nontender Extremities: No deformity; full range of motion; pulses normal; tenderness of left ASIS Neurologic: Awake, alert and oriented; motor function intact in all extremities and symmetric; no facial droop Skin: Warm and dry Psychiatric: Normal mood and affect   RESULTS  Summary of this visit's results, reviewed and interpreted by myself:   EKG Interpretation  Date/Time:    Ventricular Rate:    PR Interval:    QRS Duration:   QT Interval:    QTC  Calculation:   R Axis:     Text Interpretation:        Laboratory Studies: Results for orders placed or performed during the hospital encounter of 06/28/20 (from the past 24 hour(s))  Pregnancy, urine     Status: None   Collection Time: 06/29/20 12:35 AM  Result Value Ref Range   Preg Test, Ur NEGATIVE NEGATIVE   Imaging Studies: DG Pelvis 1-2 Views  Result Date: 06/29/2020 CLINICAL DATA:  Atraumatic left hip pain. EXAM: PELVIS - 1-2 VIEW COMPARISON:  None. FINDINGS: There is no evidence of pelvic fracture or diastasis. No pelvic bone lesions are seen. IMPRESSION: Negative. Electronically Signed   By: Virgina Norfolk M.D.   On: 06/29/2020 01:10    ED COURSE and MDM  Nursing notes, initial and subsequent vitals signs, including pulse oximetry, reviewed and interpreted by myself.  Vitals:   06/28/20 2340 06/29/20 0030 06/29/20 0035 06/29/20 0110  BP: (!) 181/85 (!) 175/83 (!) 195/87 (!) 174/80  Pulse: (!) 119 (!) 114 (!) 130 (!) 114  Resp: $Remo'18 18 18 18  'ufBBa$ Temp:      TempSrc:      SpO2: 100% 100% 100% 100%  Weight:      Height:       Medications  ibuprofen (ADVIL) tablet 800 mg (800 mg Oral Given 06/28/20 2338)    Examination consistent with sprain of origin of left sartorius muscle at ASIS. Will treat with NSAID.   PROCEDURES  Procedures   ED DIAGNOSES     ICD-10-CM   1. Iliac crest bone pain  M89.8X8        Sebastian Dzik, Jenny Reichmann, MD 06/29/20 [phone]

## 2020-06-28 NOTE — ED Notes (Signed)
Pt ambulatory to bathroom with no assistance. Basically normal gait noted.

## 2020-06-29 ENCOUNTER — Emergency Department (HOSPITAL_BASED_OUTPATIENT_CLINIC_OR_DEPARTMENT_OTHER): Payer: Medicaid Other

## 2020-06-29 LAB — PREGNANCY, URINE: Preg Test, Ur: NEGATIVE

## 2020-06-29 MED ORDER — IBUPROFEN 800 MG PO TABS: 800.0000 mg | ORAL_TABLET | Freq: Three times a day (TID) | ORAL | 0 refills | Status: AC | PRN

## 2020-06-29 NOTE — ED Notes (Signed)
Discharge instructions discussed with patient. Departs ED at this time in stable condition.

## 2020-06-29 NOTE — ED Notes (Signed)
Patient transported to X-ray 

## 2020-06-29 NOTE — Discharge Instructions (Signed)
You likely sprained your left sartorius muscle where it originates from the left anterior superior iliac spine (a bony prominence on the iliac crest, the upper part of the pelvic bone).

## 2020-07-21 ENCOUNTER — Encounter: Payer: Self-pay | Admitting: General Practice

## 2021-09-27 IMAGING — US US MFM OB DETAIL+14 WK
1 series · 16 of 28 positions shown · non-contrast
Comparison: none

[Series 1: us mfm ob detail+14 wk · 16 of 63 slices shown]
[im 1/63]
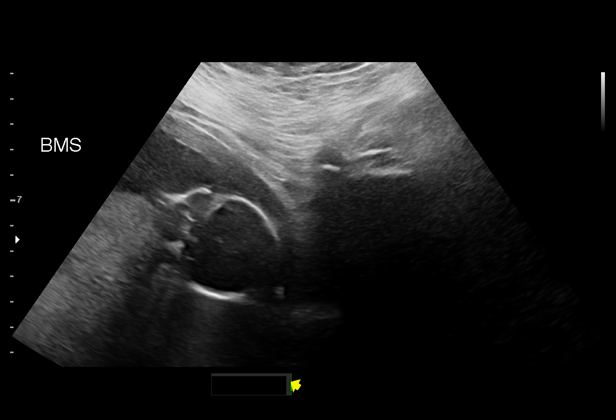
[im 5/63]
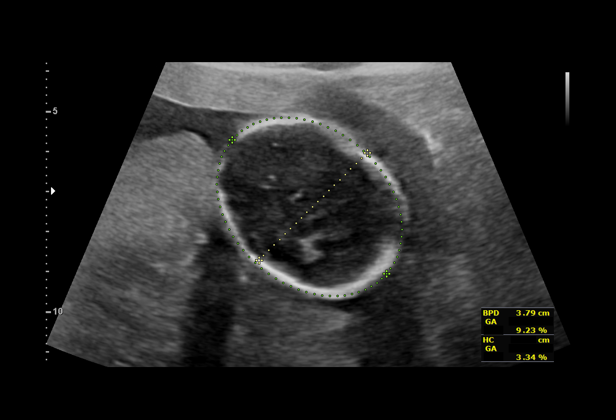
[im 10/63]
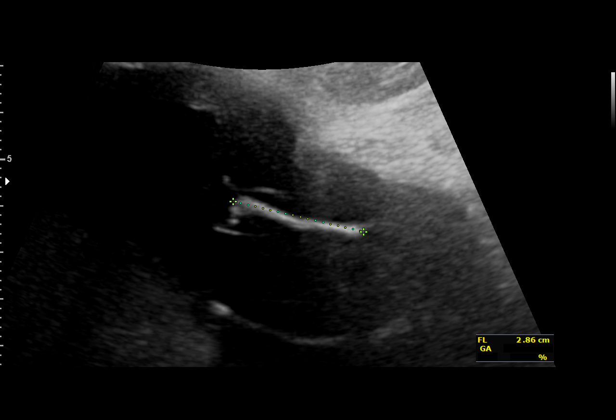
[im 14/63]
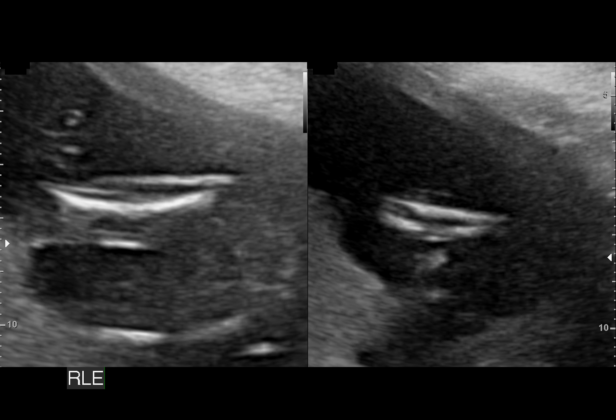
[im 17/63]
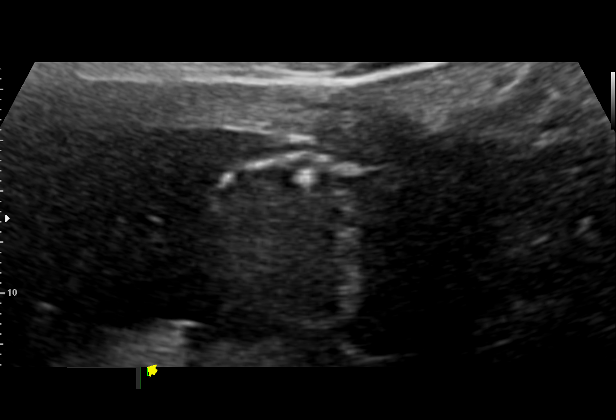
[im 21/63]
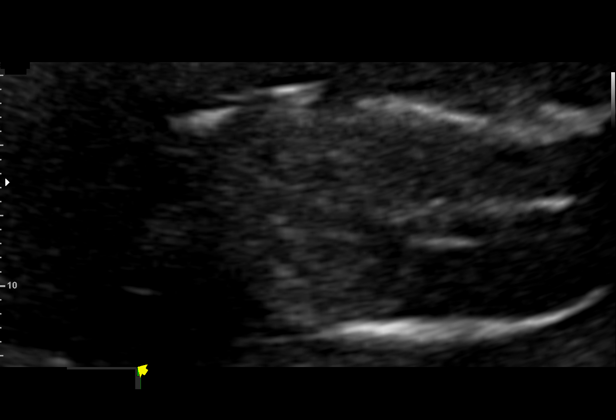
[im 26/63]
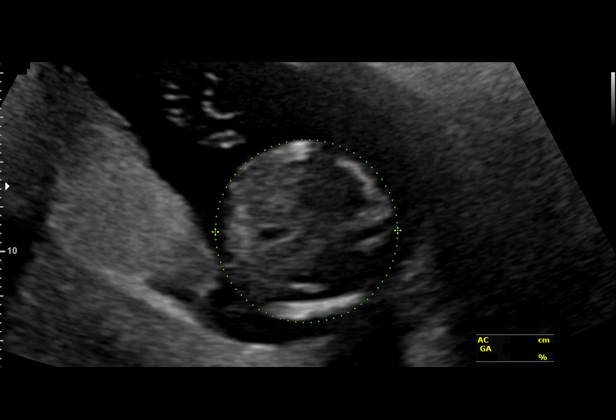
[im 30/63]
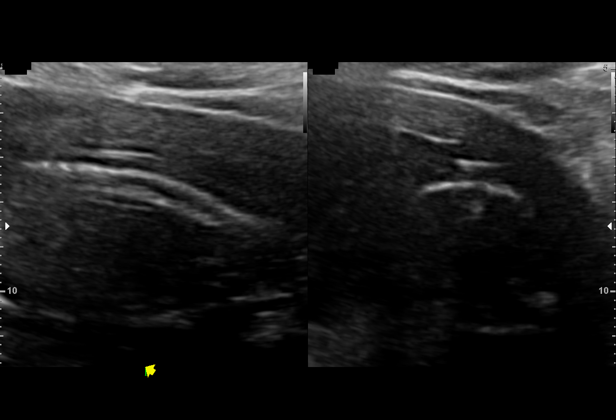
[im 33/63]
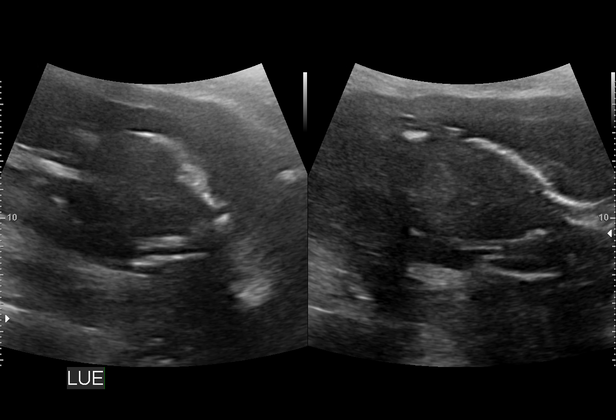
[im 37/63]
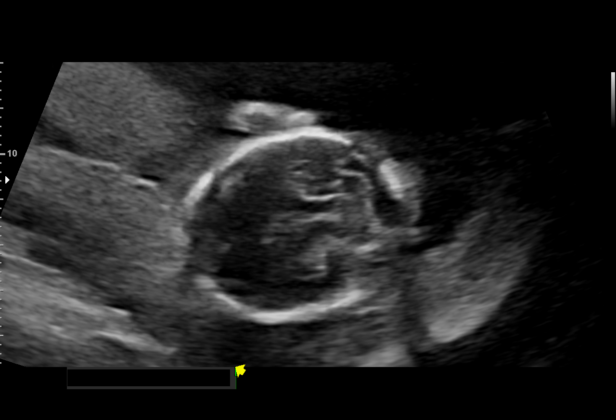
[im 42/63]
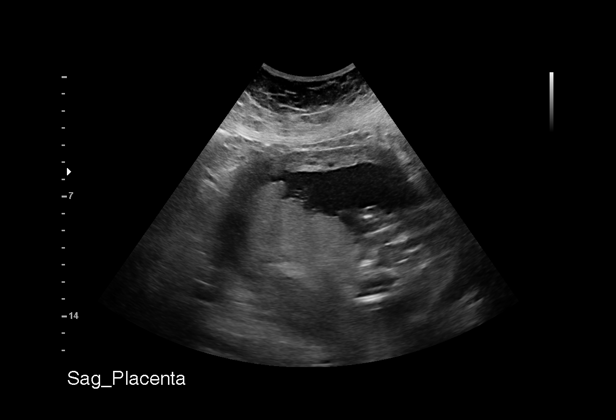
[im 46/63]
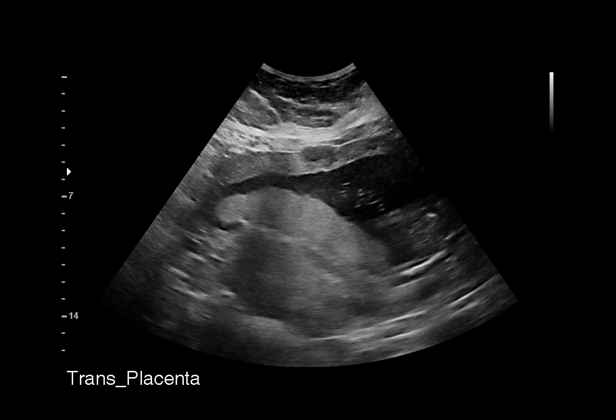
[im 49/63]
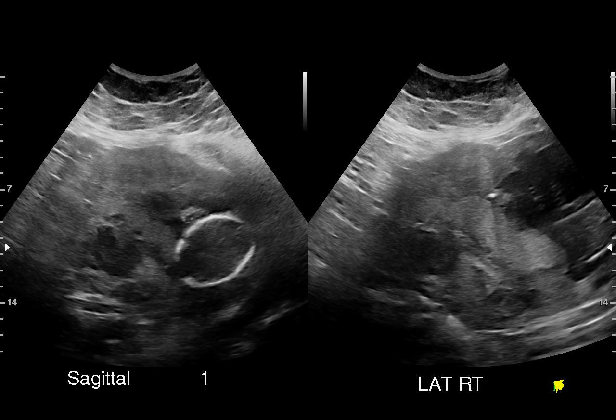
[im 53/63]
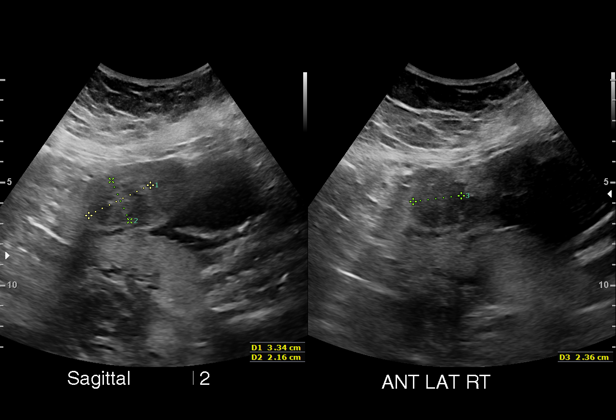
[im 58/63]
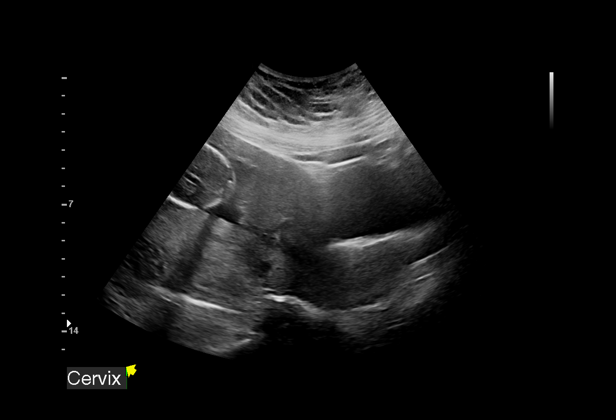
[im 63/63]
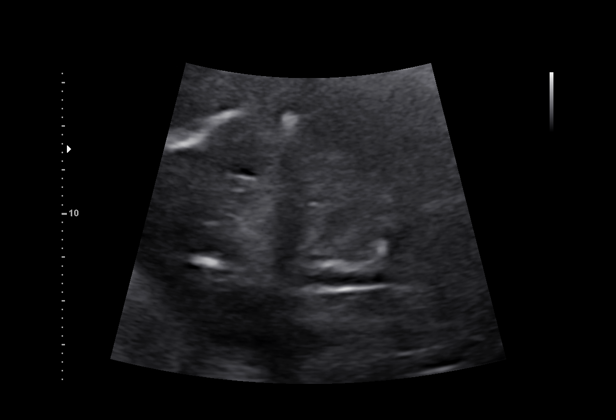

[16 of 28 positions shown; findings below may reference images not displayed]

KLPIGBB

                                                       LOVVE
 ----------------------------------------------------------------------

 ----------------------------------------------------------------------
Indications

  Pre-existing diabetes, type 2, in pregnancy,
  second trimester (insulin)
  Maternal morbid obesity BMI 48
  Hypertension - Chronic/Pre-existing
  (labetalol)
  Advanced maternal age multigravida 35+,
  second trimester
  Encounter for antenatal screening for
  malformations
  18 weeks gestation of pregnancy
 ----------------------------------------------------------------------
Vital Signs

 BMI:
Fetal Evaluation

 Num Of Fetuses:         1
 Fetal Heart Rate(bpm):  162
 Cardiac Activity:       Observed
 Presentation:           Cephalic
 Placenta:               Posterior
 P. Cord Insertion:      Visualized

 Amniotic Fluid
 AFI FV:      Within normal limits

                             Largest Pocket(cm)

Biometry

 BPD:      37.9  mm     G. Age:  17w 4d          9  %    CI:        72.48   %    70 - 86
                                                         FL/HC:      19.6   %    16.1 -
 HC:      141.6  mm     G. Age:  17w 3d          3  %    HC/AC:      1.10        1.09 -
 AC:      128.6  mm     G. Age:  18w 3d         36  %    FL/BPD:     73.1   %
 FL:       27.7  mm     G. Age:  18w 3d         34  %    FL/AC:      21.5   %    20 - 24
 CER:      18.4  mm     G. Age:  18w 1d         33  %
 CM:        2.8  mm

 Est. FW:     234  gm      0 lb 8 oz     24  %
OB History

 Gravidity:    2         Term:   1
 Living:       1
Gestational Age

 LMP:           18w 5d        Date:  03/04/19                 EDD:   12/09/19
 U/S Today:     18w 0d                                        EDD:   12/14/19
 Best:          18w 5d     Det. By:  LMP  (03/04/19)          EDD:   12/09/19
Anatomy

 Cranium:               Appears normal         Aortic Arch:            Previously seen
 Cavum:                 Appears normal         Ductal Arch:            Previously seen
 Ventricles:            Not well visualized    Diaphragm:              Not well visualized
 Choroid Plexus:        Appears normal         Stomach:                Appears normal, left
                                                                       sided
 Cerebellum:            Appears normal         Abdomen:                Appears normal
 Posterior Fossa:       Not well visualized    Abdominal Wall:         Not well visualized
 Nuchal Fold:           Not well visualized    Cord Vessels:           Appears normal (3
                                                                       vessel cord)
 Face:                  Not well visualized    Kidneys:                Appear normal
 Lips:                  Not well visualized    Bladder:                Appears normal
 Heart:                 Not well visualized    Spine:                  Ltd views no
                                                                       intracranial signs of
                                                                       NTD
 RVOT:                  Not well visualized    Upper Extremities:      Visualized
 LVOT:                  Not well visualized    Lower Extremities:      Appears normal

 Other:  Female gender
Cervix Uterus Adnexa

 Cervix
 Length:           4.05  cm.
 Not visualized (advanced GA >49wks)
Comments

 This patient was seen for a detailed fetal anatomy scan due
 to maternal morbid obesity, history of pregestational diabetes
 that is currently treated with Levemir and Humalog insulin,
 advanced maternal age, and history of chronic hypertension
 currently treated with labetalol 200 mg twice a day.  The
 patient reports that she was diagnosed with type 2 diabetes
 following her last delivery 4 years ago.  Her hemoglobin A1c
 at the time of conception was around 9%.
 She denies any other significant past medical history and
 denies any problems in her current pregnancy.
 Her cell free DNA test did not show any results due to an
 insufficient fetal DNA fraction.
 She was informed that the fetal growth and amniotic fluid
 level were appropriate for her gestational age.
 Due to extreme maternal body habitus, the views of the fetal
 anatomy were unable to be fully visualized today.
 The patient was informed that anomalies may be missed due
 to technical limitations. If the fetus is in a suboptimal position
 or maternal habitus is increased, visualization of the fetus in
 the maternal uterus may be impaired.
 The implications and management of chronic hypertension in
 pregnancy was discussed. The patient was advised that
 should her blood pressures continue to be elevated, the
 dosage of her antihypertensive medications may need to be
 increased.  The increased risk of superimposed
 preeclampsia, an indicated preterm delivery, and possible
 fetal growth restriction due to chronic hypertension in
 pregnancy was discussed.
 To decrease her risk of superimposed preeclampsia, she
 should continue taking a daily baby aspirin (81 mg daily) for
 preeclampsia prophylaxis.
 The implications and management of diabetes in pregnancy
 was discussed in detail with the patient. She was advised that
 our goals for her fingerstick values are fasting values of 90-95
 or less and two-hour postprandials of 120 or less.  Should her
 fingerstick values be above these values, her insulin dosage
 may have to be adjusted to help her achieve better glycemic
 control. The patient was advised that getting her fingerstick
 values as close to these goals as possible would provide her
 with the most optimal obstetrical outcome.
 She is already scheduled to have a fetal echocardiogram with
 pediatric cardiology on [DATE].
 The increased risk of fetal aneuploidy due to advanced
 maternal age was discussed. Due to advanced maternal age,
 the patient was offered and declined an amniocentesis today
 for definitive diagnosis of fetal aneuploidy.  The patient would
 like to have a repeat cell free DNA test drawn at her next visit
 with you.  We will await the final results from the repeat cell
 free DNA test for assessment of her risk of fetal aneuploidy.
 Due to her underlying medical conditions, the patient was
 advised that we will continue to follow her closely throughout
 her pregnancy. We will continue to follow her with monthly
 growth scans. Weekly fetal testing should be started at
 around 32 weeks.
 A follow-up exam was scheduled in 4 weeks to obtain better
 views of the fetal anatomy.
 A total of 30 minutes was spent counseling and coordinating
 the care for this patient.  Greater than 50% of the time was
 spent in direct face-to-face contact.
